# Patient Record
Sex: Female | Born: 1995 | Race: Black or African American | Hispanic: No | Marital: Single | Smoking: Current every day smoker
Health system: Southern US, Community
[De-identification: ages and names within clinical notes are randomized; demographics above are authoritative.]

## PROBLEM LIST (undated history)

## (undated) ENCOUNTER — Ambulatory Visit: Disposition: A | Discharge status: Home / Self Care | Payer: Medicaid Other

## (undated) DIAGNOSIS — A6000 Herpesviral infection of urogenital system, unspecified: Secondary | ICD-10-CM

## (undated) DIAGNOSIS — R569 Unspecified convulsions: Secondary | ICD-10-CM

## (undated) DIAGNOSIS — F329 Major depressive disorder, single episode, unspecified: Secondary | ICD-10-CM

## (undated) DIAGNOSIS — G919 Hydrocephalus, unspecified: Secondary | ICD-10-CM

## (undated) DIAGNOSIS — F319 Bipolar disorder, unspecified: Secondary | ICD-10-CM

## (undated) DIAGNOSIS — F32A Depression, unspecified: Secondary | ICD-10-CM

## (undated) DIAGNOSIS — D72829 Elevated white blood cell count, unspecified: Secondary | ICD-10-CM

## (undated) HISTORY — PX: TONSILLECTOMY: SUR1361

---

## 2013-11-18 ENCOUNTER — Encounter (HOSPITAL_COMMUNITY): Payer: Self-pay | Admitting: Emergency Medicine

## 2013-11-18 ENCOUNTER — Emergency Department (HOSPITAL_COMMUNITY)
Admission: EM | Admit: 2013-11-18 | Discharge: 2013-11-18 | Disposition: A | Discharge status: Home / Self Care | Payer: Medicaid Other | Attending: Emergency Medicine | Admitting: Emergency Medicine

## 2013-11-18 DIAGNOSIS — G919 Hydrocephalus, unspecified: Secondary | ICD-10-CM

## 2013-11-18 DIAGNOSIS — F17200 Nicotine dependence, unspecified, uncomplicated: Secondary | ICD-10-CM | POA: Insufficient documentation

## 2013-11-18 DIAGNOSIS — G911 Obstructive hydrocephalus: Secondary | ICD-10-CM | POA: Diagnosis not present

## 2013-11-18 DIAGNOSIS — Z79899 Other long term (current) drug therapy: Secondary | ICD-10-CM | POA: Diagnosis not present

## 2013-11-18 DIAGNOSIS — R519 Headache, unspecified: Secondary | ICD-10-CM

## 2013-11-18 DIAGNOSIS — R51 Headache: Secondary | ICD-10-CM | POA: Insufficient documentation

## 2013-11-18 DIAGNOSIS — G40909 Epilepsy, unspecified, not intractable, without status epilepticus: Secondary | ICD-10-CM | POA: Insufficient documentation

## 2013-11-18 HISTORY — DX: Unspecified convulsions: R56.9

## 2013-11-18 MED ORDER — LEVETIRACETAM 500 MG PO TABS: 500.0000 mg | ORAL_TABLET | Freq: Two times a day (BID) | ORAL | Status: DC

## 2013-11-18 MED ORDER — LEVETIRACETAM 500 MG PO TABS
500.0000 mg | ORAL_TABLET | Freq: Once | ORAL | Status: AC
  Administered 2013-11-18: 500 mg via ORAL
  Filled 2013-11-18: qty 1
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
## 2013-11-18 NOTE — Discharge Instructions (Signed)
You will need to be evaluated by neurology, and establish care as soon as possible. Take Keppra as directed for prevention of seizures, but make the appointment with the neurologist to have this monitored closely. Use tylenol or motrin for pain related to your headaches. Return to the ER for any worsening or changing symptoms   General Headache Without Cause A general headache is pain or discomfort felt around the head or neck area. The cause may not be found.  HOME CARE   Keep all doctor visits.  Only take medicines as told by your doctor.  Lie down in a dark, quiet room when you have a headache.  Keep a journal to find out if certain things bring on headaches. For example, write down:  What you eat and drink.  How much sleep you get.  Any change to your diet or medicines.  Relax by getting a massage or doing other relaxing activities.  Put ice or heat packs on the head and neck area as told by your doctor.  Lessen stress.  Sit up straight. Do not tighten (tense) your muscles.  Quit smoking if you smoke.  Lessen how much alcohol you drink.  Lessen how much caffeine you drink, or stop drinking caffeine.  Eat and sleep on a regular schedule.  Get 7 to 9 hours of sleep, or as told by your doctor.  Keep lights dim if bright lights bother you or make your headaches worse. GET HELP RIGHT AWAY IF:   Your headache becomes really bad.  You have a fever.  You have a stiff neck.  You have trouble seeing.  Your muscles are weak, or you lose muscle control.  You lose your balance or have trouble walking.  You feel like you will pass out (faint), or you pass out.  You have really bad symptoms that are different than your first symptoms.  You have problems with the medicines given to you by your doctor.  Your medicines do not work.  Your headache feels different than the other headaches.  You feel sick to your stomach (nauseous) or throw up (vomit). MAKE SURE YOU:     Understand these instructions.  Will watch your condition.  Will get help right away if you are not doing well or get worse. Document Released: 12/26/2007 Document Revised: 06/10/2011 Document Reviewed: 03/08/2011 [facility] Patient Information 2015 [place], [place]. This information is not intended to replace advice given to you by your health care provider. Make sure you discuss any questions you have with your health care provider.  Seizure, Adult A seizure is abnormal electrical activity in the brain. Seizures usually last from 30 seconds to 2 minutes. There are various types of seizures. Before a seizure, you may have a warning sensation (aura) that a seizure is about to occur. An aura may include the following symptoms:   Fear or anxiety.  Nausea.  Feeling like the room is spinning (vertigo).  Vision changes, such as seeing flashing lights or spots. Common symptoms during a seizure include:  A change in attention or behavior (altered mental status).  Convulsions with rhythmic jerking movements.  Drooling.  Rapid eye movements.  Grunting.  Loss of bladder and bowel control.  Bitter taste in the mouth.  Tongue biting. After a seizure, you may feel confused and sleepy. You may also have an injury resulting from convulsions during the seizure. HOME CARE INSTRUCTIONS   If you are given medicines, take them exactly as prescribed by your health care provider.  Keep all follow-up appointments as directed by your health care provider.  Do not swim or drive or engage in risky activity during which a seizure could cause further injury to you or others until your health care provider says it is OK.  Get adequate rest.  Teach friends and family what to do if you have a seizure. They should:  Lay you on the ground to prevent a fall.  Put a cushion under your head.  Loosen any tight clothing around your neck.  Turn you on your side. If vomiting occurs, this helps keep  your airway clear.  Stay with you until you recover.  Know whether or not you need emergency care. SEEK IMMEDIATE MEDICAL CARE IF:  The seizure lasts longer than 5 minutes.  The seizure is severe or you do not wake up immediately after the seizure.  You have an altered mental status after the seizure.  You are having more frequent or worsening seizures. Someone should drive you to the emergency department or call local emergency services (911 in [place] MAKE SURE YOU:  Understand these instructions.  Will watch your condition.  Will get help right away if you are not doing well or get worse. Document Released: 03/15/2000 Document Revised: 01/06/2013 Document Reviewed: 10/28/2012 [facility] Patient Information 2015 [place], [place]. This information is not intended to replace advice given to you by your health care provider. Make sure you discuss any questions you have with your health care provider.

## 2013-11-18 NOTE — ED Notes (Signed)
Pt presents with c/o headache. Pt has a hx of seizures and was seen for them earlier this week. Pt reports that she was not able to take her seizure medication today, c/o headache at this time.

## 2013-11-22 NOTE — ED Provider Notes (Signed)
Medical screening examination/treatment/procedure(s) were performed by non-physician practitioner and as supervising physician I was immediately available for consultation/collaboration.   EKG Interpretation None       Savva Beamer T Eh Sauseda, MD 11/22/13 1607 

## 2014-11-08 ENCOUNTER — Other Ambulatory Visit (HOSPITAL_COMMUNITY)
Admission: RE | Admit: 2014-11-08 | Discharge: 2014-11-08 | Disposition: A | Discharge status: Home / Self Care | Payer: Medicaid Other | Source: Ambulatory Visit | Attending: Emergency Medicine | Admitting: Emergency Medicine

## 2014-11-08 ENCOUNTER — Emergency Department (INDEPENDENT_AMBULATORY_CARE_PROVIDER_SITE_OTHER)
Admission: EM | Admit: 2014-11-08 | Discharge: 2014-11-08 | Disposition: A | Discharge status: Home / Self Care | Payer: Medicaid Other | Source: Home / Self Care | Attending: Emergency Medicine | Admitting: Emergency Medicine

## 2014-11-08 ENCOUNTER — Encounter (HOSPITAL_COMMUNITY): Payer: Self-pay | Admitting: Emergency Medicine

## 2014-11-08 DIAGNOSIS — N760 Acute vaginitis: Secondary | ICD-10-CM | POA: Insufficient documentation

## 2014-11-08 DIAGNOSIS — Z113 Encounter for screening for infections with a predominantly sexual mode of transmission: Secondary | ICD-10-CM | POA: Insufficient documentation

## 2014-11-08 DIAGNOSIS — A499 Bacterial infection, unspecified: Secondary | ICD-10-CM | POA: Diagnosis not present

## 2014-11-08 DIAGNOSIS — B9689 Other specified bacterial agents as the cause of diseases classified elsewhere: Secondary | ICD-10-CM

## 2014-11-08 DIAGNOSIS — A6000 Herpesviral infection of urogenital system, unspecified: Secondary | ICD-10-CM | POA: Diagnosis not present

## 2014-11-08 LAB — POCT PREGNANCY, URINE: Preg Test, Ur: NEGATIVE

## 2014-11-08 MED ORDER — METRONIDAZOLE 500 MG PO TABS: 500.0000 mg | ORAL_TABLET | Freq: Two times a day (BID) | ORAL | Status: DC

## 2014-11-08 NOTE — Discharge Instructions (Signed)
Bacterial Vaginosis Bacterial vaginosis is a vaginal infection that occurs when the normal balance of bacteria in the vagina is disrupted. It results from an overgrowth of certain bacteria. This is the most common vaginal infection in women of childbearing age. Treatment is important to prevent complications, especially in pregnant women, as it can cause a premature delivery. CAUSES  Bacterial vaginosis is caused by an increase in harmful bacteria that are normally present in smaller amounts in the vagina. Several different kinds of bacteria can cause bacterial vaginosis. However, the reason that the condition develops is not fully understood. RISK FACTORS Certain activities or behaviors can put you at an increased risk of developing bacterial vaginosis, including:  Having a new sex partner or multiple sex partners.  Douching.  Using an intrauterine device (IUD) for contraception. Women do not get bacterial vaginosis from toilet seats, bedding, swimming pools, or contact with objects around them. SIGNS AND SYMPTOMS  Some women with bacterial vaginosis have no signs or symptoms. Common symptoms include:  Grey vaginal discharge.  A fishlike odor with discharge, especially after sexual intercourse.  Itching or burning of the vagina and vulva.  Burning or pain with urination. DIAGNOSIS  Your health care provider will take a medical history and examine the vagina for signs of bacterial vaginosis. A sample of vaginal fluid may be taken. Your health care provider will look at this sample under a microscope to check for bacteria and abnormal cells. A vaginal pH test may also be done.  TREATMENT  Bacterial vaginosis may be treated with antibiotic medicines. These may be given in the form of a pill or a vaginal cream. A second round of antibiotics may be prescribed if the condition comes back after treatment.  HOME CARE INSTRUCTIONS   Only take over-the-counter or prescription medicines as  directed by your health care provider.  If antibiotic medicine was prescribed, take it as directed. Make sure you finish it even if you start to feel better.  Do not have sex until treatment is completed.  Tell all sexual partners that you have a vaginal infection. They should see their health care provider and be treated if they have problems, such as a mild rash or itching.  Practice safe sex by using condoms and only having one sex partner. SEEK MEDICAL CARE IF:   Your symptoms are not improving after 3 days of treatment.  You have increased discharge or pain.  You have a fever. MAKE SURE YOU:   Understand these instructions.  Will watch your condition.  Will get help right away if you are not doing well or get worse. FOR MORE INFORMATION  Centers for Disease Control and Prevention, Division of STD Prevention: [employer]raiser[employer]raud.fi American Sexual Health Association (ASHA): www.ashastd.org  Document Released: 03/18/2005 Document Revised: 01/06/2013 Document Reviewed: 10/28/2012 [facility] Patient Information 2015 [place], [place]. This information is not intended to replace advice given to you by your health care provider. Make sure you discuss any questions you have with your health care provider.  Genital Herpes Genital herpes is a sexually transmitted disease. This means that it is a disease passed by having sex with an infected person. There is no cure for genital herpes. The time between attacks can be months to years. The virus may live in a person but produce no problems (symptoms). This infection can be passed to a baby as it travels down the birth canal (vagina). In a newborn, this can cause central nervous system damage, eye damage, or even death. The  virus that causes genital herpes is usually HSV-2 virus. The virus that causes oral herpes is usually HSV-1. The diagnosis (learning what is wrong) is made through culture results. SYMPTOMS  Usually symptoms of pain and itching begin  a few days to a week after contact. It first appears as small blisters that progress to small painful ulcers which then scab over and heal after several days. It affects the outer genitalia, birth canal, cervix, penis, anal area, buttocks, and thighs. HOME CARE INSTRUCTIONS   Keep ulcerated areas dry and clean.  Take medications as directed. Antiviral medications can speed up healing. They will not prevent recurrences or cure this infection. These medications can also be taken for suppression if there are frequent recurrences.  While the infection is active, it is contagious. Avoid all sexual contact during active infections.  Condoms may help prevent spread of the herpes virus.  Practice safe sex.  Wash your hands thoroughly after touching the genital area.  Avoid touching your eyes after touching your genital area.  Inform your caregiver if you have had genital herpes and become pregnant. It is your responsibility to insure a safe outcome for your baby in this pregnancy.  Only take over-the-counter or prescription medicines for pain, discomfort, or fever as directed by your caregiver. SEEK MEDICAL CARE IF:   You have a recurrence of this infection.  You do not respond to medications and are not improving.  You have new sources of pain or discharge which have changed from the original infection.  You have an oral temperature above 102 F (38.9 C).  You develop abdominal pain.  You develop eye pain or signs of eye infection. Document Released: 03/15/2000 Document Revised: 06/10/2011 Document Reviewed: 04/05/2009 [facility] Patient Information 2015 [place], [place]. This information is not intended to replace advice given to you by your health care provider. Make sure you discuss any questions you have with your health care provider.

## 2014-11-08 NOTE — ED Provider Notes (Addendum)
CSN: [id number]     Arrival date & time 11/08/14  1300 History   First MD Initiated Contact with Patient 11/08/14 1327     Chief Complaint  Patient presents with  . Rash  . Vaginal Discharge   (Consider location/radiation/quality/duration/timing/severity/associated sxs/prior Treatment) HPI Comments: 19 year old female with a complaint of a rash to the right buttock adjacent to the anus with pain and recent occurrence of vesicles in an annular pattern. These are gradually fading away and there is only one very small lesion observed closely. Patient states they are improving and fading away. The second concern is that of a small amount of orange green vaginal discharge for 3-4 days. She is also complaining of a small amount of bleeding on a daily basis since she was placed on Implanon. She does not have a local PCP or GYN.   Past Medical History  Diagnosis Date  . Seizures    Past Surgical History  Procedure Laterality Date  . Tonsillectomy     No family history on file. History  Substance Use Topics  . Smoking status: Current Some Day Smoker  . Smokeless tobacco: Not on file  . Alcohol Use: No   OB History    No data available     Review of Systems  Constitutional: Negative.   HENT: Negative.   Respiratory: Negative.   Cardiovascular: Negative for chest pain.  Gastrointestinal: Negative.   Genitourinary: Positive for vaginal discharge, genital sores and menstrual problem. Negative for dysuria, urgency, frequency, flank pain, decreased urine volume, vaginal bleeding and pelvic pain.  Musculoskeletal: Negative.   Skin:       As per history of present illness  Neurological: Negative.   Psychiatric/Behavioral: Negative.     Allergies  Review of patient's allergies indicates no known allergies.  Home Medications   Prior to Admission medications   Medication Sig Start Date End Date Taking? Authorizing Provider  Etonogestrel (IMPLANON [place]) Inject 1 Units into the skin See  admin instructions. Every 3 years    Historical Provider, MD  levETIRAcetam (KEPPRA) 500 MG tablet Take 500 mg by mouth 2 (two) times daily.    Historical Provider, MD  levETIRAcetam (KEPPRA) 500 MG tablet Take 1 tablet (500 mg total) by mouth 2 (two) times daily. 11/18/13   Mercedes Camprubi-Soms, PA-C  metroNIDAZOLE (FLAGYL) 500 MG tablet Take 1 tablet (500 mg total) by mouth 2 (two) times daily. X 7 days 11/08/14   Hayden Rasmussen, NP   BP 121/82 mmHg  Pulse 81  Temp(Src) 99.6 F (37.6 C) (Oral)  Resp 18  SpO2 98% Physical Exam  Constitutional: She is oriented to person, place, and time. She appears well-developed and well-nourished. No distress.  Neck: Normal range of motion. Neck supple.  Cardiovascular: Normal rate.   Pulmonary/Chest: Effort normal. No respiratory distress.  Genitourinary:  Normal external female genitalia. Adjacent to the anus on the medial right buttock is a solitary and very faint red pinpoint lesion. It is difficult to visualize and palpate. (This was area and which she described a circle of papular vesicular painful lesions earlier this week.) There is a scant amount of malodorous vaginal discharge in the vaginal vault. Otherwise, there appears to be normal appearing clear mucoid physiologic fluid. The ectocervix is mildly erythematous. No lesions are visualized. Bimanual: No CMT or adnexal tenderness.  Musculoskeletal: She exhibits no edema.  Neurological: She is alert and oriented to person, place, and time. She exhibits normal muscle tone.  Skin: Skin is warm and dry.  Psychiatric: She has a normal mood and affect.  Nursing note and vitals reviewed.   ED Course  Procedures (including critical care time) Labs Review Labs Reviewed  POCT PREGNANCY, URINE   Results for orders placed or performed during the hospital encounter of 11/08/14  Pregnancy, urine POC  Result Value Ref Range   Preg Test, Ur NEGATIVE NEGATIVE   Results for orders placed or performed  during the hospital encounter of 11/08/14  Pregnancy, urine POC  Result Value Ref Range   Preg Test, Ur NEGATIVE NEGATIVE  Cervicovaginal ancillary only  Result Value Ref Range   Chlamydia **POSITIVE** (A)    Neisseria gonorrhea Negative   Cervicovaginal ancillary only  Result Value Ref Range   Wet Prep (BD Affirm) **POSITIVE for Gardnerella** (A)     Imaging Review No results found.   MDM   1. BV (bacterial vaginosis)   2. Vaginitis   3. Genital herpes    The area and which the patient describes having a rash has nearly completely resolved. Treat with Flagyl 500 mg twice a day. These to obtain PCP and GYN as soon as possible.    Hayden Rasmussen, NP 11/08/14 1418 Azithromycin 1 gm po per Rx, order to Michiel Cowboy, RN   Hayden Rasmussen, NP 11/10/14 2110

## 2014-11-08 NOTE — ED Notes (Signed)
Call back number verified.  

## 2014-11-08 NOTE — ED Notes (Signed)
C/o rash on perineal area onset 3-4 days Also reports a "greenish/orangy" vag d/c onset 3-4 days Denies fevers, chills Alert... No acute distress.

## 2014-11-09 LAB — CERVICOVAGINAL ANCILLARY ONLY
Chlamydia: POSITIVE — AB
Neisseria Gonorrhea: NEGATIVE

## 2014-11-10 LAB — CERVICOVAGINAL ANCILLARY ONLY: Wet Prep (BD Affirm): POSITIVE — AB

## 2014-11-10 NOTE — ED Notes (Signed)
Final report of STD testing shows positive chlamydia , negative GC. No treatment on day of visit for chlamydia Wet prep negative for trichomonas, yeast , positive for gardnerella treatment for BV adequate w metronidazole. Discussed findings w D Mabe, NP , who examined/treated patient. Authorized azythromycin 1 GM as a one time dose. Called number listed on record as home number. Female who answered phone stated no one by that name at that number. Letter sent to address provided at check in to [facility] to have her contact us for Rx. Form 2124 DHHS completed and faxed to [facility] for their records, and will call Rx in to pharmacy of choice when it is identified

## 2014-11-11 NOTE — ED Notes (Signed)
Called and discussed lab findings w Cheryln Manly, RN, STD nurse at [facility]. She will send a letter to residence to see if she can get a response.

## 2014-11-17 NOTE — ED Notes (Signed)
Call from patient, inquiring about her lab report. Gave a different contact number from one listed and verified at release. After verifying ID, discussed positive lab findings. Called Rx for 1 x dose of [facility]ithromycin to [facility] at [facility] at patient request. Spoke directly w Caryn Bee, pharmacist at [facility]. patient has been advised to avoid unprotected sex x 1 week to prevent re infection by her partner. She is to also inform her partner to have him treated as well, and she is to practice safer sex. Prentice Docker at [facility] advised of phone conversation w patient

## 2014-12-18 ENCOUNTER — Emergency Department (HOSPITAL_COMMUNITY)
Admission: EM | Admit: 2014-12-18 | Discharge: 2014-12-19 | Disposition: A | Discharge status: Home / Self Care | Payer: Medicaid Other | Attending: Emergency Medicine | Admitting: Emergency Medicine

## 2014-12-18 ENCOUNTER — Encounter (HOSPITAL_COMMUNITY): Payer: Self-pay | Admitting: Emergency Medicine

## 2014-12-18 DIAGNOSIS — R319 Hematuria, unspecified: Secondary | ICD-10-CM

## 2014-12-18 DIAGNOSIS — Z720 Tobacco use: Secondary | ICD-10-CM | POA: Diagnosis not present

## 2014-12-18 DIAGNOSIS — N390 Urinary tract infection, site not specified: Secondary | ICD-10-CM | POA: Insufficient documentation

## 2014-12-18 DIAGNOSIS — Z3202 Encounter for pregnancy test, result negative: Secondary | ICD-10-CM | POA: Insufficient documentation

## 2014-12-18 DIAGNOSIS — R109 Unspecified abdominal pain: Secondary | ICD-10-CM | POA: Diagnosis present

## 2014-12-18 LAB — COMPREHENSIVE METABOLIC PANEL
ALT: 18 U/L (ref 14–54)
AST: 20 U/L (ref 15–41)
Albumin: 4.6 g/dL (ref 3.5–5.0)
Alkaline Phosphatase: 53 U/L (ref 38–126)
Anion gap: 12 (ref 5–15)
BILIRUBIN TOTAL: 1.2 mg/dL (ref 0.3–1.2)
BUN: 14 mg/dL (ref 6–20)
CO2: 23 mmol/L (ref 22–32)
Calcium: 9.3 mg/dL (ref 8.9–10.3)
Chloride: 105 mmol/L (ref 101–111)
Creatinine, Ser: 0.78 mg/dL (ref 0.44–1.00)
GFR calc Af Amer: >60 mL/min (ref >60)
Glucose, Bld: 95 mg/dL (ref 65–99)
POTASSIUM: 3.5 mmol/L (ref 3.5–5.1)
Sodium: 140 mmol/L (ref 135–145)
TOTAL PROTEIN: 7.7 g/dL (ref 6.5–8.1)

## 2014-12-18 LAB — URINALYSIS, ROUTINE W REFLEX MICROSCOPIC
Glucose, UA: NEGATIVE mg/dL
Ketones, ur: >80 mg/dL — AB
Nitrite: POSITIVE — AB
Protein, ur: >300 mg/dL — AB
Specific Gravity, Urine: 1.028 (ref 1.005–1.030)
Urobilinogen, UA: 1 mg/dL (ref 0.0–1.0)
pH: 6.5 (ref 5.0–8.0)

## 2014-12-18 LAB — CBC
HCT: 44.9 % (ref 36.0–46.0)
Hemoglobin: 15.4 g/dL — ABNORMAL HIGH (ref 12.0–15.0)
MCH: 31.1 pg (ref 26.0–34.0)
MCHC: 34.3 g/dL (ref 30.0–36.0)
MCV: 90.7 fL (ref 78.0–100.0)
Platelets: 264 10*3/uL (ref 150–400)
RBC: 4.95 MIL/uL (ref 3.87–5.11)
RDW: 12.4 % (ref 11.5–15.5)
WBC: 19.6 10*3/uL — ABNORMAL HIGH (ref 4.0–10.5)

## 2014-12-18 LAB — I-STAT BETA HCG BLOOD, ED (MC, WL, AP ONLY): I-stat hCG, quantitative: <5 m[IU]/mL (ref <5)

## 2014-12-18 LAB — GRAM STAIN: Special Requests: NORMAL

## 2014-12-18 LAB — WET PREP, GENITAL

## 2014-12-18 LAB — URINE MICROSCOPIC-ADD ON

## 2014-12-18 LAB — LIPASE, BLOOD: Lipase: 28 U/L (ref 22–51)

## 2014-12-18 MED ORDER — HYDROMORPHONE HCL 1 MG/ML IJ SOLN
0.5000 mg | Freq: Once | INTRAMUSCULAR | Status: AC
  Administered 2014-12-18: 0.5 mg via INTRAVENOUS
  Filled 2014-12-18: qty 1

## 2014-12-18 MED ORDER — CEFTRIAXONE SODIUM 1 G IJ SOLR
1.0000 g | Freq: Once | INTRAMUSCULAR | Status: AC
  Administered 2014-12-18: 1 g via INTRAVENOUS
  Filled 2014-12-18: qty 10

## 2014-12-18 MED ORDER — SODIUM CHLORIDE 0.9 % IV BOLUS (SEPSIS)
1000.0000 mL | Freq: Once | INTRAVENOUS | Status: AC
  Administered 2014-12-18: 1000 mL via INTRAVENOUS

## 2014-12-18 MED ORDER — ONDANSETRON 4 MG PO TBDP: 4.0000 mg | ORAL_TABLET | Freq: Three times a day (TID) | ORAL | Status: DC | PRN

## 2014-12-18 MED ORDER — ONDANSETRON HCL 4 MG/2ML IJ SOLN
4.0000 mg | Freq: Once | INTRAMUSCULAR | Status: AC
  Administered 2014-12-18: 4 mg via INTRAVENOUS
  Filled 2014-12-18: qty 2

## 2014-12-18 MED ORDER — SULFAMETHOXAZOLE-TRIMETHOPRIM 800-160 MG PO TABS: 1.0000 | ORAL_TABLET | Freq: Two times a day (BID) | ORAL | Status: AC

## 2014-12-18 NOTE — ED Notes (Signed)
Reported urine gram stain results to Dr. Alycia Rossetti. MD acknowledges, no new orders.

## 2014-12-18 NOTE — ED Provider Notes (Signed)
CSN: [id number]     Arrival date & time 12/18/14  1913 History   First MD Initiated Contact with Patient 12/18/14 1936     Chief Complaint  Patient presents with  . Abdominal Pain   Patient is a 19 y.o. female presenting with general illness. The history is provided by the patient. No language interpreter was used.  Illness Location:  Abdomen Quality:  Pain Severity:  Moderate Onset quality:  Gradual Timing:  Constant Progression:  Worsening Chronicity:  New Context:  Abdominal pain. Onset during past week. Associated with nausea and nonbloody nonbilious emesis as well as dysuria and urinary frequency. Denies diarrhea or constipation. Denies fever, chills, SOB, CP, cough, vaginal bleeding, or vaginal discharge Associated symptoms: abdominal pain and vomiting   Associated symptoms: no congestion, no cough, no diarrhea, no fever, no headaches, no loss of consciousness, no rhinorrhea and no shortness of breath     Past Medical History  Diagnosis Date  . Seizures    Past Surgical History  Procedure Laterality Date  . Tonsillectomy     History reviewed. No pertinent family history. Social History  Substance Use Topics  . Smoking status: Current Some Day Smoker  . Smokeless tobacco: None  . Alcohol Use: No   OB History    No data available      Review of Systems  Constitutional: Negative for fever and chills.  HENT: Negative for congestion and rhinorrhea.   Respiratory: Negative for cough and shortness of breath.   Gastrointestinal: Positive for vomiting and abdominal pain. Negative for diarrhea and constipation.  Genitourinary: Positive for dysuria and frequency. Negative for vaginal bleeding and vaginal discharge.  Neurological: Negative for loss of consciousness and headaches.  All other systems reviewed and are negative.   Allergies  Review of patient's allergies indicates no known allergies.  Home Medications   Prior to Admission medications   Medication Sig  Start Date End Date Taking? Authorizing Provider  Etonogestrel (IMPLANON [place]) Inject 1 Units into the skin See admin instructions. Every 3 years   Yes Historical Provider, MD  ondansetron (ZOFRAN ODT) 4 MG disintegrating tablet Take 1 tablet (4 mg total) by mouth every 8 (eight) hours as needed for nausea or vomiting. 12/18/14   Angelina Ok, MD  sulfamethoxazole-trimethoprim (BACTRIM DS,SEPTRA DS) 800-160 MG per tablet Take 1 tablet by mouth 2 (two) times daily. 12/18/14 12/25/14  Angelina Ok, MD   BP 119/88 mmHg  Pulse 78  Temp(Src) 99 F (37.2 C) (Oral)  Resp 18  Ht  (1.6 m)  Wt 119 lb 6 oz (54.148 kg)  BMI 21.15 kg/m2  SpO2 100%  LMP 12/18/2014   Physical Exam  Constitutional: She is oriented to person, place, and time. She appears well-developed and well-nourished. She appears distressed.  HENT:  Head: Normocephalic and atraumatic.  Eyes: Conjunctivae are normal. Pupils are equal, round, and reactive to light.  Neck: Normal range of motion. Neck supple.  Cardiovascular: Normal rate, regular rhythm and intact distal pulses.   Pulmonary/Chest: Effort normal and breath sounds normal.  Abdominal: Soft. Bowel sounds are normal.  Genitourinary:  GU exam notable for small amount of blood at cervical os, no cervical friability, no CMT no adnexal tenderness  Musculoskeletal: Normal range of motion.  Neurological: She is alert and oriented to person, place, and time.  Skin: Skin is warm and dry.    ED Course  Procedures   Labs Review Labs Reviewed  WET PREP, GENITAL - Abnormal; Notable for the following:  Yeast Wet Prep HPF POC NONE (*)    Trich, Wet Prep NONE (*)    Clue Cells Wet Prep HPF POC NONE (*)    WBC, Wet Prep HPF POC FEW (*)    All other components within normal limits  CBC - Abnormal; Notable for the following:    WBC 19.6 (*)    Hemoglobin 15.4 (*)    All other components within normal limits  URINALYSIS, ROUTINE W REFLEX MICROSCOPIC (NOT AT [facility]) -  Abnormal; Notable for the following:    Color, Urine RED (*)    APPearance TURBID (*)    Hgb urine dipstick LARGE (*)    Bilirubin Urine LARGE (*)    Ketones, ur >80 (*)    Protein, ur >300 (*)    Nitrite POSITIVE (*)    Leukocytes, UA MODERATE (*)    All other components within normal limits  URINE MICROSCOPIC-ADD ON - Abnormal; Notable for the following:    Bacteria, UA MANY (*)    All other components within normal limits  GRAM STAIN  CULTURE, BLOOD (ROUTINE X 2)  CULTURE, BLOOD (ROUTINE X 2)  LIPASE, BLOOD  COMPREHENSIVE METABOLIC PANEL  I-STAT BETA HCG BLOOD, ED ([facility], [facility], [facility] ONLY)  GC/CHLAMYDIA PROBE AMP ([place]) NOT AT [facility]    Imaging Review No results found. I have personally reviewed and evaluated these images and lab results as part of my medical decision-making.   EKG Interpretation None      MDM  Ms. Googe is a 19 year old female presenting with abdominal pain. Onset during past week. Associated with nausea and nonbloody nonbilious emesis as well as dysuria and urinary frequency. Denies diarrhea or constipation. Denies fever, chills, SOB, CP, cough, vaginal bleeding, or vaginal discharge. Patient seen for same pain 4 days ago at which time she had a CT abdomen pelvis showing no acute intra-abdominal normal pathology.  Exam above notable for young female lying in stretcher in mild to moderate distress secondary to pain. Heart rate 70s 80s. Normotensive. Breathing well on room air and maintaining saturations. Patient complaining of abdominal pain but when distracted exam is benign with soft abdomen and no tenderness to palpation. GU exam showing small amount of blood at cervical os, no cervical friability, no CMT no adnexal tenderness. CVA TTP  Urine pregnant negative. UA showing large amount of leukocytes, positive nitrates, and moderate dehydration. Wet prep negative for yeast, Trichomonas, clue cells. Gonorrhea and Chlamydia sent but patient does not require  him. Treatment based on GU exam. CMP unremarkable. WBC 19.6. Given recent normal CT scan do not feel patient needs additional imaging.  Presentation consistent with UTI and possible pyelonephritis. Some improvement in symptoms after IV fluids, IV antiemetics, and IV antibiotics. Will attempt outpatient treatment with by mouth antibiotics. Strict ED return pressures discussed. Patient and boyfriend both understand and agree with the plan of no further questions concerning this time.  Patient care discussed with and followed by my attending, Dr. Blane Ohara  Final diagnoses:  Urinary tract infection with hematuria, site unspecified    Angelina Ok, MD 12/19/14 4098  Blane Ohara, MD 12/22/14 2217

## 2014-12-18 NOTE — ED Notes (Signed)
Per EMS, the patient was calling out for abdominal pain from home. En route was having pseudo seizures. Patient was sitting on the kitchen floor, shaking and then start crying. Occasionally stopped asking questions. Poor historians from people at home with her. Vomiting at some point as evidenced by vomit in trash can. 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 cbg 103, 134/98, p 100, rr 20, 97% on room air. IV present in right wrist. Picked up from [address]

## 2014-12-18 NOTE — ED Notes (Signed)
Called main lab, spoke to [place], in regards to adding on gram stain to urine sample

## 2014-12-19 LAB — GC/CHLAMYDIA PROBE AMP (~~LOC~~) NOT AT ARMC
Chlamydia: NEGATIVE
Neisseria Gonorrhea: NEGATIVE

## 2014-12-19 MED ORDER — ONDANSETRON 4 MG PO TBDP
4.0000 mg | ORAL_TABLET | Freq: Once | ORAL | Status: AC
  Administered 2014-12-19: 4 mg via ORAL
  Filled 2014-12-19: qty 1

## 2014-12-19 NOTE — ED Notes (Signed)
Dr. Alycia Rossetti at the bedside to update patient and family.

## 2014-12-19 NOTE — ED Notes (Signed)
Dr. Alycia Rossetti called as patient is still vomiting, and reporting pain medication did not help.

## 2014-12-23 LAB — CULTURE, BLOOD (ROUTINE X 2)
CULTURE: NO GROWTH
Culture: NO GROWTH

## 2016-07-14 ENCOUNTER — Encounter (HOSPITAL_COMMUNITY): Payer: Self-pay | Admitting: Emergency Medicine

## 2016-07-14 ENCOUNTER — Emergency Department (HOSPITAL_COMMUNITY)
Admission: EM | Admit: 2016-07-14 | Discharge: 2016-07-14 | Disposition: A | Discharge status: Home / Self Care | Payer: Medicaid Other | Attending: Emergency Medicine | Admitting: Emergency Medicine

## 2016-07-14 DIAGNOSIS — K0889 Other specified disorders of teeth and supporting structures: Secondary | ICD-10-CM | POA: Insufficient documentation

## 2016-07-14 DIAGNOSIS — Z79899 Other long term (current) drug therapy: Secondary | ICD-10-CM | POA: Insufficient documentation

## 2016-07-14 DIAGNOSIS — F17210 Nicotine dependence, cigarettes, uncomplicated: Secondary | ICD-10-CM | POA: Insufficient documentation

## 2016-07-14 MED ORDER — PENICILLIN V POTASSIUM 500 MG PO TABS: 500.0000 mg | ORAL_TABLET | Freq: Three times a day (TID) | ORAL | 0 refills | Status: DC

## 2016-07-14 MED ORDER — IBUPROFEN 800 MG PO TABS: 800.0000 mg | ORAL_TABLET | Freq: Three times a day (TID) | ORAL | 0 refills | Status: DC

## 2016-07-14 NOTE — ED Triage Notes (Signed)
Pt. Stated, I think My wisdom teeth ate coming in for the last 3 days and I need something more than Ibuprofen.

## 2016-07-14 NOTE — Discharge Instructions (Signed)
Please call and follow up with a dentist tomorrow for further management of your dental pain.

## 2016-07-14 NOTE — ED Provider Notes (Signed)
[facility]-EMERGENCY DEPT Provider Note   CSN: [id number] Arrival date & time: 07/14/16  4098     History   Chief Complaint Chief Complaint  Patient presents with  . Dental Pain    HPI Yvonne Waller is a 21 y.o. female.  HPI   21 year old female with history of seizure presenting complaining of dental pain. Patient report for the past 1 week she has had persistent throbbing sharp pain to her right lower molar not adequately improved despite taking Advil, ibuprofen, Orajel, and using heat pack at home. Pain is moderate in intensity, worse with chewing. Pain now radiates towards her jaw. She missed 2-3 days of work due to Pain. She denies having fever, chills, hearing changes, ear pain, sore throat, neck pain, or rash. Denies any injury. Last measure. Was 07/01/16. She does not have a Education officer, community.    Past Medical History:  Diagnosis Date  . Seizures (HCC)     There are no active problems to display for this patient.   Past Surgical History:  Procedure Laterality Date  . TONSILLECTOMY      OB History    No data available       Home Medications    Prior to Admission medications   Medication Sig Start Date End Date Taking? Authorizing Provider  Etonogestrel (IMPLANON [place]) Inject 1 Units into the skin See admin instructions. Every 3 years    Historical Provider, MD  ondansetron (ZOFRAN ODT) 4 MG disintegrating tablet Take 1 tablet (4 mg total) by mouth every 8 (eight) hours as needed for nausea or vomiting. 12/18/14   Angelina Ok, MD    Family History No family history on file.  Social History Social History  Substance Use Topics  . Smoking status: Current Some Day Smoker  . Smokeless tobacco: Current User  . Alcohol use No     Allergies   Patient has no known allergies.   Review of Systems Review of Systems  Constitutional: Negative for fever.  HENT: Positive for dental problem.   Skin: Negative for wound.  Neurological: Negative for numbness.      Physical Exam Updated Vital Signs BP 95/64 (BP Location: Left Arm)   Pulse 96   Temp 98.9 F (37.2 C) (Oral)   Resp 17   Ht  (1.626 m)   Wt 52.2 kg   LMP 07/01/2016   SpO2 97%   BMI 19.74 kg/m   Physical Exam  Constitutional: She appears well-developed and well-nourished. No distress.  HENT:  Head: Atraumatic.  Right Ear: External ear normal.  Left Ear: External ear normal.  Mouth: Tenderness noted to tooth #32 on palpation. No significant dental decay noted. No gingival erythema or abscess. No trismus.  Eyes: Conjunctivae are normal.  Neck: Neck supple.  Neurological: She is alert.  Skin: No rash noted.  Psychiatric: She has a normal mood and affect.  Nursing note and vitals reviewed.    ED Treatments / Results  Labs (all labs ordered are listed, but only abnormal results are displayed) Labs Reviewed - No data to display  EKG  EKG Interpretation None       Radiology No results found.  Procedures Procedures (including critical care time)  Medications Ordered in ED Medications - No data to display   Initial Impression / Assessment and Plan / ED Course  I have reviewed the triage vital signs and the nursing notes.  Pertinent labs & imaging results that were available during my care of the patient were reviewed by  me and considered in my medical decision making (see chart for details).     BP 95/64 (BP Location: Left Arm)   Pulse 96   Temp 98.9 F (37.2 C) (Oral)   Resp 17   Ht $RemoveBefo m)   Wt 52.2 kg   LMP 07/01/2016   SpO2 97%   BMI 19.74 kg/m    Final Clinical Impressions(s) / ED Diagnoses   Final diagnoses:  Pain, dental    New Prescriptions New Prescriptions   IBUPROFEN (ADVIL,MOTRIN) 800 MG TABLET    Take 1 tablet (800 mg total) by mouth 3 (three) times daily.   PENICILLIN V POTASSIUM (VEETID) 500 MG TABLET    Take 1 tablet (500 mg total) by mouth 3 (three) times daily.   Patient with dentalgia.  No abscess  requiring immediate incision and drainage.  Exam not concerning for Ludwig's angina or pharyngeal abscess.  Will treat with PCN, NSAIDs. Pt instructed to follow-up with dentist.  Discussed return precautions. Pt safe for discharge.    Fayrene Helper, PA-C 07/14/16 1610    Rolan Bucco, MD 07/14/16 [phone]

## 2016-09-15 ENCOUNTER — Encounter (HOSPITAL_COMMUNITY): Payer: Self-pay | Admitting: Oncology

## 2016-09-15 ENCOUNTER — Emergency Department (HOSPITAL_COMMUNITY): Payer: Medicaid Other

## 2016-09-15 ENCOUNTER — Emergency Department (HOSPITAL_COMMUNITY)
Admission: EM | Admit: 2016-09-15 | Discharge: 2016-09-15 | Disposition: A | Discharge status: Home / Self Care | Payer: Medicaid Other | Attending: Emergency Medicine | Admitting: Emergency Medicine

## 2016-09-15 DIAGNOSIS — F17200 Nicotine dependence, unspecified, uncomplicated: Secondary | ICD-10-CM | POA: Insufficient documentation

## 2016-09-15 DIAGNOSIS — R0789 Other chest pain: Secondary | ICD-10-CM | POA: Insufficient documentation

## 2016-09-15 DIAGNOSIS — Z79899 Other long term (current) drug therapy: Secondary | ICD-10-CM | POA: Insufficient documentation

## 2016-09-15 NOTE — ED Triage Notes (Signed)
Pt bib [facility] from home d/t a panic attack.  Per EMS pt reported waking up feeling as if she couldn't breathe.  Per EMS pt slept in the ambulance en route.  Pt ambulatory in triage.

## 2016-09-15 NOTE — ED Provider Notes (Signed)
[facility]-EMERGENCY DEPT Provider Note   CSN: [id number] Arrival date & time: 09/15/16  0342     History   Chief Complaint Chief Complaint  Patient presents with  . Panic Attack    HPI Yvonne Waller is a 21 y.o. female.  21 yo F with a cc of chest pain. This been going on for the past couple weeks. Worse with movement palpation. Last for a few minutes after onset. Having some mild shortness of breath with it. Denies exertional symptoms. Denies lower extremity edema denies estrogen use denies recent travel or recent surgery. No family history of MI.   The history is provided by the patient.  Chest Pain   This is a new problem. The current episode started more than 1 week ago. The problem occurs constantly. The problem has not changed since onset.The pain is present in the lateral region. The pain is at a severity of 7/10. The pain is moderate. The quality of the pain is described as brief and sharp. The pain does not radiate. Duration of episode(s) is 2 weeks. Associated symptoms include shortness of breath. Pertinent negatives include no dizziness, no fever, no headaches, no nausea, no palpitations and no vomiting. She has tried nothing for the symptoms. The treatment provided no relief. There are no known risk factors.  Pertinent negatives for past medical history include no DVT, no hyperlipidemia, no hypertension, no MI and no PE.  Pertinent negatives for family medical history include: no CAD.    Past Medical History:  Diagnosis Date  . Seizures (HCC)     There are no active problems to display for this patient.   Past Surgical History:  Procedure Laterality Date  . TONSILLECTOMY      OB History    No data available       Home Medications    Prior to Admission medications   Medication Sig Start Date End Date Taking? Authorizing Provider  Etonogestrel (IMPLANON [place]) Inject 1 Units into the skin See admin instructions. Every 3 years    [provider]    ibuprofen (ADVIL,MOTRIN) 800 MG tablet Take 1 tablet (800 mg total) by mouth 3 (three) times daily. 07/14/16   Fayrene Helper, PA-C  ondansetron (ZOFRAN ODT) 4 MG disintegrating tablet Take 1 tablet (4 mg total) by mouth every 8 (eight) hours as needed for nausea or vomiting. 12/18/14   Angelina Ok, MD  penicillin v potassium (VEETID) 500 MG tablet Take 1 tablet (500 mg total) by mouth 3 (three) times daily. 07/14/16   Fayrene Helper, PA-C    Family History No family history on file.  Social History Social History  Substance Use Topics  . Smoking status: Current Some Day Smoker  . Smokeless tobacco: Current User  . Alcohol use No     Allergies   Patient has no known allergies.   Review of Systems Review of Systems  Constitutional: Negative for chills and fever.  HENT: Negative for congestion and rhinorrhea.   Eyes: Negative for redness and visual disturbance.  Respiratory: Positive for shortness of breath. Negative for wheezing.   Cardiovascular: Positive for chest pain. Negative for palpitations.  Gastrointestinal: Negative for nausea and vomiting.  Genitourinary: Negative for dysuria and urgency.  Musculoskeletal: Negative for arthralgias and myalgias.  Skin: Negative for pallor and wound.  Neurological: Negative for dizziness and headaches.     Physical Exam Updated Vital Signs BP 99/64   Pulse 76   Temp 98.9 F (37.2 C) (Oral)   Resp 18  LMP 09/04/2016 (Approximate)   SpO2 96%   Physical Exam  Constitutional: She is oriented to person, place, and time. She appears well-developed and well-nourished. No distress.  HENT:  Head: Normocephalic and atraumatic.  Eyes: EOM are normal. Pupils are equal, round, and reactive to light.  Neck: Normal range of motion. Neck supple.  Cardiovascular: Normal rate and regular rhythm.  Exam reveals no gallop and no friction rub.   No murmur heard. Pulmonary/Chest: Effort normal. She has no wheezes. She has no rales.  Abdominal:  Soft. She exhibits no distension and no mass. There is no tenderness. There is no guarding.  Musculoskeletal: She exhibits no edema or tenderness.  Neurological: She is alert and oriented to person, place, and time.  Skin: Skin is warm and dry. She is not diaphoretic.  Psychiatric: She has a normal mood and affect. Her behavior is normal.  Nursing note and vitals reviewed.    ED Treatments / Results  Labs (all labs ordered are listed, but only abnormal results are displayed) Labs Reviewed - No data to display  EKG  EKG Interpretation  Date/Time:  "Sunday September 15 2016 04:20:01 EDT Ventricular Rate:  96 PR Interval:    QRS Duration: 82 QT Interval:  342 QTC Calculation: 433 R Axis:   79 Text Interpretation:  Sinus rhythm RSR' in V1 or V2, right VCD or RVH No old tracing to compare Confirmed by ,  ([id number]) on 09/15/2016 6:18:02 AM       Radiology Dg Chest 2 View  Result Date: 09/15/2016 CLINICAL DATA:  Acute onset of mid chest pressure. Initial encounter. EXAM: CHEST  2 VIEW COMPARISON:  None. FINDINGS: The lungs are well-aerated and clear. There is no evidence of focal opacification, pleural effusion or pneumothorax. The heart is normal in size; the mediastinal contour is within normal limits. No acute osseous abnormalities are seen. IMPRESSION: No acute cardiopulmonary process seen. Electronically Signed   By: Jeffery  Chang M.D.   On: 09/15/2016 05:39    Procedures Procedures (including critical care time)  Medications Ordered in ED Medications - No data to display   Initial Impression / Assessment and Plan / ED Course  I have reviewed the triage vital signs and the nursing notes.  Pertinent labs & imaging results that were available during my care of the patient were reviewed by me and considered in my medical decision making (see chart for details).     20"  yo F With a chief complaints of left-sided chest wall pain reproduced on palpation. Most likely  musculoskeletal. EKG and chest x-ray are unremarkable.  6:32 AM:  I have discussed the diagnosis/risks/treatment options with the patient and family and believe the pt to be eligible for discharge home to follow-up with PCP. We also discussed returning to the ED immediately if new or worsening sx occur. We discussed the sx which are most concerning (e.g., sudden worsening pain, fever, inability to tolerate by mouth) that necessitate immediate return. Medications administered to the patient during their visit and any new prescriptions provided to the patient are listed below.  Medications given during this visit Medications - No data to display   The patient appears reasonably screen and/or stabilized for discharge and I doubt any other medical condition or other [facility] requiring further screening, evaluation, or treatment in the ED at this time prior to discharge.    Final Clinical Impressions(s) / ED Diagnoses   Final diagnoses:  Chest wall pain    New Prescriptions New Prescriptions  No medications on file     Melene Plan, [place] 09/15/16 1610

## 2016-09-15 NOTE — Discharge Instructions (Signed)
Take 4 over the counter ibuprofen tablets 3 times a day or 2 over-the-counter naproxen tablets twice a day for pain. Also take tylenol 1000mg(2 extra strength) four times a day.    

## 2016-10-10 ENCOUNTER — Emergency Department (HOSPITAL_COMMUNITY)
Admission: EM | Admit: 2016-10-10 | Discharge: 2016-10-10 | Disposition: A | Discharge status: Home / Self Care | Payer: Medicaid Other | Attending: Emergency Medicine | Admitting: Emergency Medicine

## 2016-10-10 ENCOUNTER — Encounter (HOSPITAL_COMMUNITY): Payer: Self-pay

## 2016-10-10 DIAGNOSIS — L0291 Cutaneous abscess, unspecified: Secondary | ICD-10-CM | POA: Insufficient documentation

## 2016-10-10 DIAGNOSIS — Z720 Tobacco use: Secondary | ICD-10-CM | POA: Insufficient documentation

## 2016-10-10 HISTORY — DX: Depression, unspecified: F32.A

## 2016-10-10 HISTORY — DX: Hydrocephalus, unspecified: G91.9

## 2016-10-10 HISTORY — DX: Major depressive disorder, single episode, unspecified: F32.9

## 2016-10-10 HISTORY — DX: Herpesviral infection of urogenital system, unspecified: A60.00

## 2016-10-10 HISTORY — DX: Bipolar disorder, unspecified: F31.9

## 2016-10-10 LAB — URINALYSIS, ROUTINE W REFLEX MICROSCOPIC
Bilirubin Urine: NEGATIVE
Glucose, UA: NEGATIVE mg/dL
KETONES UR: NEGATIVE mg/dL
Leukocytes, UA: NEGATIVE
NITRITE: NEGATIVE
PROTEIN: NEGATIVE mg/dL
Specific Gravity, Urine: 1.02 (ref 1.005–1.030)
pH: 7 (ref 5.0–8.0)

## 2016-10-10 MED ORDER — LIDOCAINE HCL 2 % IJ SOLN
20.0000 mL | Freq: Once | INTRAMUSCULAR | Status: DC
  Filled 2016-10-10: qty 20

## 2016-10-10 NOTE — ED Triage Notes (Addendum)
Pt states she shaved bikini area about 1 weeks ago and now has several large bumps between rectum and vaginal causing pain and itching

## 2016-10-10 NOTE — ED Notes (Addendum)
EDP in with pt lancing abscess.  Abscess has started draining on its own.  I&D not needed.

## 2016-10-10 NOTE — Discharge Instructions (Signed)
Please read attached information. If you experience any new or worsening signs or symptoms please return to the emergency room for evaluation. Please follow-up with your primary care provider or specialist as discussed. Please use medication prescribed only as directed and discontinue taking if you have any concerning signs or symptoms.   °

## 2016-10-10 NOTE — ED Provider Notes (Signed)
[facility]-EMERGENCY DEPT Provider Note   CSN: [id number] Arrival date & time: 10/10/16  1601  By signing my name below, I, Yvonne Waller, attest that this documentation has been prepared under the direction and in the presence of Newell RubbermaidJeffrey Maylin [place], PA-C.  Electronically Signed: Rosario AdieWilliam Andrew Waller, ED Scribe. 10/10/16. 9:08 PM.  History   Chief Complaint Chief Complaint  Patient presents with  . Vaginal Itching   The history is provided by the patient. No language interpreter was used.   HPI Comments: Yvonne Waller is a 21 y.o. female with a PMHx of genital herpes, who presents to the Emergency Department complaining of persistent groin/vaginal itching beginning approximately one week ago. Pt reports that she shaved her groin area one week ago with a single straight blade and since then she has had vaginal itching and irritation to the area. She reports that this discomfort is worse with sitting and ambulation. She also notes a moderate, worsening area of pain and swelling to the area which began following shaving as well. No noted treatments for her symptoms were tried prior to coming into the ED. She denies fever, chills, drainage from the area, dysuria, vaginal discharge, nausea, vomiting, or any other associated symptoms.   Past Medical History:  Diagnosis Date  . Bipolar 1 disorder (HCC)   . Depression   . Herpes genitalis   . Hydrocephalus   . Seizures (HCC)    There are no active problems to display for this patient.  Past Surgical History:  Procedure Laterality Date  . TONSILLECTOMY     OB History    No data available     Home Medications    Prior to Admission medications   Medication Sig Start Date End Date Taking? Authorizing Provider  Etonogestrel (IMPLANON [place]) Inject 1 Units into the skin See admin instructions. Every 3 years    [provider]  ibuprofen (ADVIL,MOTRIN) 800 MG tablet Take 1 tablet (800 mg total) by mouth 3 (three) times daily. 07/14/16    Fayrene Helperran, [place], PA-C  ondansetron (ZOFRAN ODT) 4 MG disintegrating tablet Take 1 tablet (4 mg total) by mouth every 8 (eight) hours as needed for nausea or vomiting. 12/18/14   Angelina OkFranasiak, Ryan, MD  penicillin v potassium (VEETID) 500 MG tablet Take 1 tablet (500 mg total) by mouth 3 (three) times daily. 07/14/16   Fayrene Helperran, [place], PA-C   Family History No family history on file.  Social History Social History  Substance Use Topics  . Smoking status: Current Some Day Smoker  . Smokeless tobacco: Current User  . Alcohol use No   Allergies   Patient has no known allergies.  Review of Systems Review of Systems  Constitutional: Negative for fever.  Gastrointestinal: Negative for nausea and vomiting.  Genitourinary: Negative for dysuria and vaginal discharge.       +vaginal itching  Skin: Positive for wound.  All other systems reviewed and are negative.  Physical Exam Updated Vital Signs BP 117/69 (BP Location: Right Arm)   Pulse 80   Temp 98.4 F (36.9 C) (Oral)   Resp 16   LMP 10/10/2016   SpO2 100%   Physical Exam  Constitutional: She appears well-developed and well-nourished. No distress.  HENT:  Head: Normocephalic and atraumatic.  Eyes: Conjunctivae are normal.  Neck: Normal range of motion.  Cardiovascular: Normal rate.   Pulmonary/Chest: Effort normal.  Abdominal: She exhibits no distension.  Genitourinary:  Genitourinary Comments: Chaperone present throughout entire exam. 1cm abscess to the right upper inner  thigh.  Musculoskeletal: Normal range of motion.  Neurological: She is alert.  Skin: No pallor.  Psychiatric: She has a normal mood and affect. Her behavior is normal.  Nursing note and vitals reviewed.  ED Treatments / Results  DIAGNOSTIC STUDIES: Oxygen Saturation is 100% on RA, normal by my interpretation.   COORDINATION OF CARE: 6:20 PM-Discussed next steps with pt. Pt verbalized understanding and is agreeable with the plan.   Labs (all labs ordered  are listed, but only abnormal results are displayed) Labs Reviewed  URINALYSIS, ROUTINE W REFLEX MICROSCOPIC - Abnormal; Notable for the following:       Result Value   APPearance HAZY (*)    Hgb urine dipstick SMALL (*)    Bacteria, UA RARE (*)    Squamous Epithelial / LPF 0-5 (*)    All other components within normal limits    EKG  EKG Interpretation None       Radiology No results found.  Procedures Procedures   Medications Ordered in ED Medications  lidocaine (XYLOCAINE) 2 % (with pres) injection 400 mg (400 mg Infiltration Not Given 10/10/16 1851)    Initial Impression / Assessment and Plan / ED Course  I have reviewed the triage vital signs and the nursing notes.  Pertinent labs & imaging results that were available during my care of the patient were reviewed by me and considered in my medical decision making (see chart for details).      Assessment/Plan: Patient with small abscess.  This abscess ruptured prior to I&D.  Warm compress, no need for antibiotics.  Return precautions given.  Patient verbalized understanding and agreement to today's plan.  Final Clinical Impressions(s) / ED Diagnoses   Final diagnoses:  Abscess   New Prescriptions Discharge Medication List as of 10/10/2016  7:50 PM     [address]     Yvonne Mechanic, PA-C 10/10/16 2108    Doug Sou, MD 10/10/16 2330

## 2016-11-12 ENCOUNTER — Encounter (HOSPITAL_COMMUNITY): Payer: Self-pay | Admitting: Emergency Medicine

## 2016-11-12 ENCOUNTER — Emergency Department (HOSPITAL_COMMUNITY): Payer: Self-pay

## 2016-11-12 ENCOUNTER — Emergency Department (HOSPITAL_COMMUNITY)
Admission: EM | Admit: 2016-11-12 | Discharge: 2016-11-12 | Disposition: A | Discharge status: Home / Self Care | Payer: Self-pay | Attending: Emergency Medicine | Admitting: Emergency Medicine

## 2016-11-12 DIAGNOSIS — A599 Trichomoniasis, unspecified: Secondary | ICD-10-CM | POA: Insufficient documentation

## 2016-11-12 DIAGNOSIS — F17210 Nicotine dependence, cigarettes, uncomplicated: Secondary | ICD-10-CM | POA: Insufficient documentation

## 2016-11-12 DIAGNOSIS — R112 Nausea with vomiting, unspecified: Secondary | ICD-10-CM | POA: Insufficient documentation

## 2016-11-12 DIAGNOSIS — R197 Diarrhea, unspecified: Secondary | ICD-10-CM | POA: Insufficient documentation

## 2016-11-12 DIAGNOSIS — R1033 Periumbilical pain: Secondary | ICD-10-CM | POA: Insufficient documentation

## 2016-11-12 LAB — CBC
HEMATOCRIT: 43.4 % (ref 36.0–46.0)
HEMOGLOBIN: 14.9 g/dL (ref 12.0–15.0)
MCH: 31.2 pg (ref 26.0–34.0)
MCHC: 34.3 g/dL (ref 30.0–36.0)
MCV: 90.8 fL (ref 78.0–100.0)
Platelets: 239 10*3/uL (ref 150–400)
RBC: 4.78 MIL/uL (ref 3.87–5.11)
RDW: 13.1 % (ref 11.5–15.5)
WBC: 20.1 10*3/uL — ABNORMAL HIGH (ref 4.0–10.5)

## 2016-11-12 LAB — URINALYSIS, ROUTINE W REFLEX MICROSCOPIC
BILIRUBIN URINE: NEGATIVE
GLUCOSE, UA: NEGATIVE mg/dL
KETONES UR: NEGATIVE mg/dL
NITRITE: NEGATIVE
Protein, ur: NEGATIVE mg/dL
Specific Gravity, Urine: 1.024 (ref 1.005–1.030)
pH: 5 (ref 5.0–8.0)

## 2016-11-12 LAB — COMPREHENSIVE METABOLIC PANEL
ALBUMIN: 4.9 g/dL (ref 3.5–5.0)
ALT: 11 U/L — ABNORMAL LOW (ref 14–54)
ANION GAP: 7 (ref 5–15)
AST: 17 U/L (ref 15–41)
Alkaline Phosphatase: 57 U/L (ref 38–126)
BUN: 12 mg/dL (ref 6–20)
CO2: 24 mmol/L (ref 22–32)
Calcium: 9.3 mg/dL (ref 8.9–10.3)
Chloride: 109 mmol/L (ref 101–111)
Creatinine, Ser: 0.62 mg/dL (ref 0.44–1.00)
GFR calc non Af Amer: >60 mL/min (ref >60)
GLUCOSE: 90 mg/dL (ref 65–99)
POTASSIUM: 4.1 mmol/L (ref 3.5–5.1)
SODIUM: 140 mmol/L (ref 135–145)
Total Bilirubin: 0.5 mg/dL (ref 0.3–1.2)
Total Protein: 7.8 g/dL (ref 6.5–8.1)

## 2016-11-12 LAB — WET PREP, GENITAL
Clue Cells Wet Prep HPF POC: NONE SEEN
SPERM: NONE SEEN
Yeast Wet Prep HPF POC: NONE SEEN

## 2016-11-12 LAB — LIPASE, BLOOD: Lipase: 21 U/L (ref 11–51)

## 2016-11-12 LAB — PREGNANCY, URINE: Preg Test, Ur: NEGATIVE

## 2016-11-12 MED ORDER — SODIUM CHLORIDE 0.9 % IV BOLUS (SEPSIS)
1000.0000 mL | Freq: Once | INTRAVENOUS | Status: AC
  Administered 2016-11-12: 1000 mL via INTRAVENOUS

## 2016-11-12 MED ORDER — METRONIDAZOLE 500 MG PO TABS
2000.0000 mg | ORAL_TABLET | Freq: Once | ORAL | Status: AC
  Administered 2016-11-12: 2000 mg via ORAL
  Filled 2016-11-12: qty 4

## 2016-11-12 MED ORDER — AZITHROMYCIN 250 MG PO TABS
1000.0000 mg | ORAL_TABLET | Freq: Once | ORAL | Status: AC
  Administered 2016-11-12: 1000 mg via ORAL
  Filled 2016-11-12: qty 4

## 2016-11-12 MED ORDER — IOPAMIDOL (ISOVUE-300) INJECTION 61%
INTRAVENOUS | Status: AC
  Filled 2016-11-12: qty 30

## 2016-11-12 MED ORDER — MORPHINE SULFATE (PF) 2 MG/ML IV SOLN
4.0000 mg | Freq: Once | INTRAVENOUS | Status: AC
  Administered 2016-11-12: 4 mg via INTRAVENOUS
  Filled 2016-11-12: qty 2

## 2016-11-12 MED ORDER — ONDANSETRON HCL 4 MG/2ML IJ SOLN
4.0000 mg | Freq: Once | INTRAMUSCULAR | Status: AC
  Administered 2016-11-12: 4 mg via INTRAVENOUS
  Filled 2016-11-12: qty 2

## 2016-11-12 MED ORDER — IOPAMIDOL (ISOVUE-300) INJECTION 61%
100.0000 mL | Freq: Once | INTRAVENOUS | Status: AC | PRN
  Administered 2016-11-12: 80 mL via INTRAVENOUS

## 2016-11-12 MED ORDER — CEFTRIAXONE SODIUM 250 MG IJ SOLR
250.0000 mg | Freq: Once | INTRAMUSCULAR | Status: AC
  Administered 2016-11-12: 250 mg via INTRAMUSCULAR
  Filled 2016-11-12: qty 250

## 2016-11-12 MED ORDER — LIDOCAINE HCL (PF) 1 % IJ SOLN
2.0000 mL | Freq: Once | INTRAMUSCULAR | Status: AC
  Administered 2016-11-12: 2 mL
  Filled 2016-11-12: qty 30

## 2016-11-12 MED ORDER — SODIUM CHLORIDE 0.9 % IJ SOLN
INTRAMUSCULAR | Status: AC
  Filled 2016-11-12: qty 50

## 2016-11-12 MED ORDER — IOPAMIDOL (ISOVUE-300) INJECTION 61%
INTRAVENOUS | Status: AC
  Filled 2016-11-12: qty 100

## 2016-11-12 NOTE — ED Triage Notes (Signed)
Pt arrived via EMS c/o generalized abd pain that started this morning with vomiting. Pt reports decreased appetite x 1 week. Denies urinary symptoms.

## 2016-11-12 NOTE — ED Notes (Signed)
Patient transported to CT 

## 2016-11-12 NOTE — ED Notes (Signed)
Patient requesting additional pain medication. PA made aware. 

## 2016-11-12 NOTE — ED Notes (Signed)
Bed: [id number] Expected date:  Expected time:  Means of arrival:  Comments: 21 y/o F abd pain NVD

## 2016-11-12 NOTE — Discharge Instructions (Signed)
°  Please inform all sexual partners about diagnosis as they will need to be tested as well.  Use a condom with every sexual encounter Follow up with your OBGYN or the healthy department in regards to today's visit.   Please return to the ER for new or worsening symptoms, high fevers or persistent vomiting.  You have been tested for chlamydia and gonorrhea. These results will be available in approximately 3 days. You will be notified if they are positive.

## 2016-11-12 NOTE — ED Notes (Signed)
IV attempted x 2 by another Rn without success.

## 2016-11-12 NOTE — ED Notes (Signed)
IV attempted x2 without success.

## 2016-11-12 NOTE — ED Notes (Signed)
US at bedside

## 2016-11-12 NOTE — ED Provider Notes (Signed)
[facility]-EMERGENCY DEPT Provider Note   CSN: [id number] Arrival date & time: 11/12/16  1255     History   Chief Complaint Chief Complaint  Patient presents with  . Abdominal Pain    HPI Yvonne Waller is a 21 y.o. female.  The history is provided by the patient and medical records. No language interpreter was used.   Yvonne Waller is a 21 y.o. female  with a PMH of bipolar disorder, depression, seizures who presents to the Emergency Department complaining of acute onset of central and RLQ abdominal pain which began this morning. Patient states that she felt "quesy" at the end of her shift last night. She didn't eat anything for dinner, but instead, just went to bed. This morning, again felt nauseous, then sudden onset of pain began. She endorses 5-6 episodes of emesis and has not been able to keep any food/fluids down today. Also endorses 2 loose stools, unsure if bloody or not. Denies fevers/chills. No sick contacts. No alleviating/aggravating factors noted. No medications taken prior to arrival for symptoms. She is on her menstrual cycle which she started two days ago. No chest pain, shortness of breath, back pain, dysuria, urinary urgency/frequency, vaginal discharge.    Past Medical History:  Diagnosis Date  . Bipolar 1 disorder (HCC)   . Depression   . Herpes genitalis   . Hydrocephalus   . Seizures (HCC)     There are no active problems to display for this patient.   Past Surgical History:  Procedure Laterality Date  . TONSILLECTOMY      OB History    No data available       Home Medications    Prior to Admission medications   Medication Sig Start Date End Date Taking? Authorizing Provider  Etonogestrel (IMPLANON [place]) Inject 1 Units into the skin See admin instructions. Every 3 years   Yes [provider]  ibuprofen (ADVIL,MOTRIN) 800 MG tablet Take 1 tablet (800 mg total) by mouth 3 (three) times daily. Patient not taking: Reported on 11/12/2016  07/14/16   Fayrene Helperran, [place], PA-C  ondansetron (ZOFRAN ODT) 4 MG disintegrating tablet Take 1 tablet (4 mg total) by mouth every 8 (eight) hours as needed for nausea or vomiting. Patient not taking: Reported on 11/12/2016 12/18/14   Angelina OkFranasiak, Ryan, MD  penicillin v potassium (VEETID) 500 MG tablet Take 1 tablet (500 mg total) by mouth 3 (three) times daily. Patient not taking: Reported on 11/12/2016 07/14/16   Fayrene Helperran, [place], PA-C    Family History No family history on file.  Social History Social History  Substance Use Topics  . Smoking status: Current Every Day Smoker  . Smokeless tobacco: Current User  . Alcohol use No     Allergies   Patient has no known allergies.   Review of Systems Review of Systems  Gastrointestinal: Positive for abdominal pain, diarrhea, nausea and vomiting.  All other systems reviewed and are negative.    Physical Exam Updated Vital Signs BP 96/62 (BP Location: Right Arm)   Pulse 69   Temp 98.1 F (36.7 C) (Oral)   Resp 12   Ht 5\' 3"  (1.6 m)   Wt 53.1 kg (117 lb)   LMP 11/10/2016   SpO2 99%   BMI 20.73 kg/m   Physical Exam  Constitutional: She is oriented to person, place, and time. She appears well-developed and well-nourished. No distress.  HENT:  Head: Normocephalic and atraumatic.  Cardiovascular: Normal rate, regular rhythm and normal heart sounds.   No  murmur heard. Pulmonary/Chest: Effort normal and breath sounds normal. No respiratory distress.  Abdominal: Soft. She exhibits no distension.  Tenderness to palpation at McBurney's with guarding. No rebound tenderness. No CVA tenderness.  Musculoskeletal: Normal range of motion.  Neurological: She is alert and oriented to person, place, and time.  Skin: Skin is warm and dry.  Nursing note and vitals reviewed.    ED Treatments / Results  Labs (all labs ordered are listed, but only abnormal results are displayed) Labs Reviewed  WET PREP, GENITAL - Abnormal; Notable for the following:         Result Value   Trich, Wet Prep PRESENT (*)    WBC, Wet Prep HPF POC FEW (*)    All other components within normal limits  COMPREHENSIVE METABOLIC PANEL - Abnormal; Notable for the following:    ALT 11 (*)    All other components within normal limits  CBC - Abnormal; Notable for the following:    WBC 20.1 (*)    All other components within normal limits  URINALYSIS, ROUTINE W REFLEX MICROSCOPIC - Abnormal; Notable for the following:    Hgb urine dipstick MODERATE (*)    Leukocytes, UA TRACE (*)    Bacteria, UA MANY (*)    Squamous Epithelial / LPF 0-5 (*)    All other components within normal limits  LIPASE, BLOOD  PREGNANCY, URINE  GC/CHLAMYDIA PROBE AMP ([place]) NOT AT [facility]    EKG  EKG Interpretation None       Radiology US Transvaginal Non-ob  Result Date: 11/12/2016 CLINICAL DATA:  Lower abdominal/ pelvic pain EXAM: TRANSABDOMINAL AND TRANSVAGINAL ULTRASOUND OF PELVIS TECHNIQUE: Study was performed transabdominally to optimize pelvic field of view evaluation and transvaginally to optimize internal visceral architecture evaluation. COMPARISON:  CT abdomen and pelvis November 12, 2016 FINDINGS: Uterus Measurements: 8.5 x 4.3 x 5.2 cm. No fibroids or other mass visualized. Endometrium Thickness: 2 mm.  No focal abnormality visualized. Right ovary Measurements: 3.2 x 2.7 x 2.5 cm. There is a cystic area, likely dominant follicle, arising in the right ovary measuring 2.3 x 2.3 x 2.1 cm. No other right-sided pelvic mass. There is a small amount of adjacent free pelvic fluid. Left ovary Measurements: 2.7 x 1.6 x 2.0 cm. Normal appearance/no adnexal mass. Other findings Small amount of free pelvic fluid adjacent to right ovary. IMPRESSION: Small cystic area, likely dominant follicle, and right ovary with a small amount of adjacent fluid. Question recent ovarian cyst leakage. Study otherwise unremarkable. Electronically Signed   By: Bretta Bang III M.D.   On: 11/12/2016 19:52    US Pelvis Complete  Result Date: 11/12/2016 CLINICAL DATA:  Lower abdominal/ pelvic pain EXAM: TRANSABDOMINAL AND TRANSVAGINAL ULTRASOUND OF PELVIS TECHNIQUE: Study was performed transabdominally to optimize pelvic field of view evaluation and transvaginally to optimize internal visceral architecture evaluation. COMPARISON:  CT abdomen and pelvis November 12, 2016 FINDINGS: Uterus Measurements: 8.5 x 4.3 x 5.2 cm. No fibroids or other mass visualized. Endometrium Thickness: 2 mm.  No focal abnormality visualized. Right ovary Measurements: 3.2 x 2.7 x 2.5 cm. There is a cystic area, likely dominant follicle, arising in the right ovary measuring 2.3 x 2.3 x 2.1 cm. No other right-sided pelvic mass. There is a small amount of adjacent free pelvic fluid. Left ovary Measurements: 2.7 x 1.6 x 2.0 cm. Normal appearance/no adnexal mass. Other findings Small amount of free pelvic fluid adjacent to right ovary. IMPRESSION: Small cystic area, likely dominant follicle, and right  ovary with a small amount of adjacent fluid. Question recent ovarian cyst leakage. Study otherwise unremarkable. Electronically Signed   By: Bretta Bang III M.D.   On: 11/12/2016 19:52   Ct Abdomen Pelvis W Contrast  Result Date: 11/12/2016 CLINICAL DATA:  Abdominal pain and vomiting starting this morning. Reduced appetite. EXAM: CT ABDOMEN AND PELVIS WITH CONTRAST TECHNIQUE: Multidetector CT imaging of the abdomen and pelvis was performed using the standard protocol following bolus administration of intravenous contrast. CONTRAST:  80mL ISOVUE-300 IOPAMIDOL (ISOVUE-300) INJECTION 61% COMPARISON:  Chest radiograph from 09/15/2016 FINDINGS: Lower chest: Unremarkable Hepatobiliary: Unremarkable Pancreas: Unremarkable Spleen: Unremarkable Adrenals/Urinary Tract: Adrenal glands normal. 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 Stomach/Bowel: The appendix appears  normal particularly on the multiplanar imaging. Terminal ileum unremarkable. Borderline prominent caliber of the cecum but no cecal wall thickening. No dilated bowel. Several air-fluid levels are present in nondilated mid abdominal small bowel. Vascular/Lymphatic: Unremarkable Reproductive: 2.7 by 1.7 by 2.2 cm cystic lesion in the right ovary. Indistinctness of tissue planes especially along the ovaries and parametrial region, with a lesser degree of stranding extending in the right lower quadrant omentum. Uterine contour normal. No specific left ovarian abnormality. Other: As noted above there is stranding especially in the pelvis causing obscuration of margins of the uterus and adnexa. Is potentially a small amount of free pelvic fluid in the cul-de-sac. Musculoskeletal: Sacral Tarlov cause thinning of the sacrum. IMPRESSION: 1. Stranding in the pelvis potentially with a small amount of free pelvic fluid. There is some subtle stranding in the lower omentum, right greater the left. There is also a 2.7 cm right ovarian cystic lesion which could conceivably be a source for inflammation. I note that the patient's pregnancy test is negative today and accordingly ectopic pregnancy is not suspected. This could be a complex cyst. I am more skeptical of tubo-ovarian abscess given the relatively thin walls, but pelvic sonography may be warranted for further workup. 2. The appendix does not appear inflamed. 3. 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 4. 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 Electronically Signed   By: Gaylyn Rong M.D.   On: 11/12/2016 17:10    Procedures Procedures (including critical care time)  Medications Ordered in ED Medications  iopamidol (ISOVUE-300) 61 % injection (not administered)  iopamidol (ISOVUE-300) 61 % injection (not administered)  sodium chloride 0.9 % injection (not administered)  cefTRIAXone  (ROCEPHIN) injection 250 mg (not administered)  azithromycin (ZITHROMAX) tablet 1,000 mg (not administered)  metroNIDAZOLE (FLAGYL) tablet 2,000 mg (not administered)  lidocaine (PF) (XYLOCAINE) 1 % injection 2 mL (not administered)  sodium chloride 0.9 % bolus 1,000 mL (1,000 mLs Intravenous New Bag/Given 11/12/16 1503)  morphine 2 MG/ML injection 4 mg (4 mg Intravenous Given 11/12/16 1504)  ondansetron (ZOFRAN) injection 4 mg (4 mg Intravenous Given 11/12/16 1504)  iopamidol (ISOVUE-300) 61 % injection 100 mL (80 mLs Intravenous Contrast Given 11/12/16 1644)     Initial Impression / Assessment and Plan / ED Course  I have reviewed the triage vital signs and the nursing notes.  Pertinent labs & imaging results that were available during my care of the patient were reviewed by me and considered in my medical decision making (see chart for details).    Yvonne Waller is a 21 y.o. female who presents to ED for abdominal pain which  began this morning. Tenderness to RLQ on exam. Labs notable for leukocytosis of 20.1. UA with many bacteria and trich present. Patient not having any urinary symptoms. Wet prep with trich and few wbc's. CT obtained and reviewed:  IMPRESSION: 1. Stranding in the pelvis potentially with a small amount of free pelvic fluid. There is some subtle stranding in the lower omentum, right greater the left. There is also a 2.7 cm right ovarian cystic lesion which could conceivably be a source for inflammation. I note that the patient's pregnancy test is negative today and accordingly ectopic pregnancy is not suspected. This could be a complex cyst. I am more skeptical of tubo-ovarian abscess given the relatively thin walls, but pelvic sonography may be warranted for further workup. 2. The appendix does not appear inflamed.  Pelvic exam with no cervical motion or adnexal tenderness, however given elevated white count and bacteria in urine, will obtain pelvic ultrasound for  further characterization to r/o TOA.   Ultrasound reviewed showing a small cystic area consistent with a follicle otherwise unremarkable. Repeat abdominal exam with no peritoneal signs. Will have patient follow up with OB/GYN. Patient treated prophylactically for gonorrhea and chlamydia with azithromycin/Rocephin. 2 g Flagyl given for Trichomonas. Patient informed to tell all sexual partners without diagnosis. Results return to ER discussed and all questions answered.  Final Clinical Impressions(s) / ED Diagnoses   Final diagnoses:  Trichomonosis    New Prescriptions New Prescriptions   No medications on file     Ziyad Dyar, Chase Picket, Cordelia Poche 11/12/16 2038    Benjiman Core, MD 11/13/16 [phone]

## 2016-11-13 ENCOUNTER — Emergency Department (HOSPITAL_COMMUNITY)
Admission: EM | Admit: 2016-11-13 | Discharge: 2016-11-13 | Disposition: A | Discharge status: Home / Self Care | Payer: Self-pay | Attending: Physician Assistant | Admitting: Physician Assistant

## 2016-11-13 ENCOUNTER — Emergency Department (HOSPITAL_COMMUNITY): Payer: Self-pay

## 2016-11-13 ENCOUNTER — Encounter (HOSPITAL_COMMUNITY): Payer: Self-pay | Admitting: Emergency Medicine

## 2016-11-13 DIAGNOSIS — N83291 Other ovarian cyst, right side: Secondary | ICD-10-CM | POA: Insufficient documentation

## 2016-11-13 DIAGNOSIS — N83201 Unspecified ovarian cyst, right side: Secondary | ICD-10-CM

## 2016-11-13 DIAGNOSIS — F17200 Nicotine dependence, unspecified, uncomplicated: Secondary | ICD-10-CM | POA: Insufficient documentation

## 2016-11-13 LAB — COMPREHENSIVE METABOLIC PANEL
ALT: 12 U/L — ABNORMAL LOW (ref 14–54)
ANION GAP: 13 (ref 5–15)
AST: 26 U/L (ref 15–41)
Albumin: 4.5 g/dL (ref 3.5–5.0)
Alkaline Phosphatase: 49 U/L (ref 38–126)
BUN: 8 mg/dL (ref 6–20)
CHLORIDE: 105 mmol/L (ref 101–111)
CO2: 20 mmol/L — ABNORMAL LOW (ref 22–32)
Calcium: 9.5 mg/dL (ref 8.9–10.3)
Creatinine, Ser: 0.74 mg/dL (ref 0.44–1.00)
Glucose, Bld: 96 mg/dL (ref 65–99)
POTASSIUM: 3.9 mmol/L (ref 3.5–5.1)
Sodium: 138 mmol/L (ref 135–145)
TOTAL PROTEIN: 7.2 g/dL (ref 6.5–8.1)
Total Bilirubin: 0.9 mg/dL (ref 0.3–1.2)

## 2016-11-13 LAB — URINALYSIS, ROUTINE W REFLEX MICROSCOPIC
Bilirubin Urine: NEGATIVE
GLUCOSE, UA: NEGATIVE mg/dL
KETONES UR: 80 mg/dL — AB
NITRITE: NEGATIVE
PH: 5 (ref 5.0–8.0)
PROTEIN: NEGATIVE mg/dL
Specific Gravity, Urine: 1.028 (ref 1.005–1.030)

## 2016-11-13 LAB — CBC
HCT: 44 % (ref 36.0–46.0)
Hemoglobin: 15.2 g/dL — ABNORMAL HIGH (ref 12.0–15.0)
MCH: 31.7 pg (ref 26.0–34.0)
MCHC: 34.5 g/dL (ref 30.0–36.0)
MCV: 91.7 fL (ref 78.0–100.0)
PLATELETS: 219 10*3/uL (ref 150–400)
RBC: 4.8 MIL/uL (ref 3.87–5.11)
RDW: 13.1 % (ref 11.5–15.5)
WBC: 20.2 10*3/uL — AB (ref 4.0–10.5)

## 2016-11-13 LAB — POC URINE PREG, ED: Preg Test, Ur: NEGATIVE

## 2016-11-13 LAB — GC/CHLAMYDIA PROBE AMP (~~LOC~~) NOT AT ARMC
Chlamydia: POSITIVE — AB
NEISSERIA GONORRHEA: NEGATIVE

## 2016-11-13 MED ORDER — ONDANSETRON HCL 4 MG PO TABS: 4.0000 mg | ORAL_TABLET | Freq: Three times a day (TID) | ORAL | 0 refills | Status: AC | PRN

## 2016-11-13 MED ORDER — SODIUM CHLORIDE 0.9 % IV BOLUS (SEPSIS)
1000.0000 mL | Freq: Once | INTRAVENOUS | Status: AC
  Administered 2016-11-13: 1000 mL via INTRAVENOUS

## 2016-11-13 MED ORDER — DOXYCYCLINE HYCLATE 100 MG PO TABS
100.0000 mg | ORAL_TABLET | Freq: Once | ORAL | Status: AC
  Administered 2016-11-13: 100 mg via ORAL
  Filled 2016-11-13: qty 1

## 2016-11-13 MED ORDER — ONDANSETRON HCL 4 MG/2ML IJ SOLN
4.0000 mg | Freq: Once | INTRAMUSCULAR | Status: AC
  Administered 2016-11-13: 4 mg via INTRAVENOUS
  Filled 2016-11-13: qty 2

## 2016-11-13 MED ORDER — DOXYCYCLINE HYCLATE 50 MG PO CAPS: 100.0000 mg | ORAL_CAPSULE | Freq: Two times a day (BID) | ORAL | 0 refills | Status: DC

## 2016-11-13 NOTE — ED Triage Notes (Addendum)
Pt arrives via [facility] from home for c/o generalized abd pain and vomiting since yesterday, [facility] and diagnosed with std and ovarian cyst. Pt crying and shaking on bed upon arrival. EMS reports pt requested morphine, no meds given pta.

## 2016-11-13 NOTE — Discharge Instructions (Signed)
You were evaluated in the ED for abdominal pain. We rechecked imaging today which is still showing the ovarian cyst. We are going to treat you with antibiotic doxycycline twice a day for 14 days to be careful and prevent pelvic inflammatory disease. Please establish with a primary care doctor to follow up on this as outpatient, contact information is below.

## 2016-11-13 NOTE — ED Notes (Signed)
Patient transported to Ultrasound 

## 2016-11-13 NOTE — ED Notes (Signed)
ED Provider at bedside. 

## 2016-11-13 NOTE — ED Provider Notes (Signed)
[facility]-EMERGENCY DEPT Provider Note   CSN: [id number] Arrival date & time: 11/13/16  6962     History   Chief Complaint Chief Complaint  Patient presents with  . Abdominal Pain    HPI Yvonne Waller is a 21 y.o. female.  Patient is a 20yo with PMH of bipolar disorder, depression, seizures who presents to ED with worsening abdominal pain. Was seen yesterday for sudden onset RLQ pain and was treated with trichomoniasis and told had ovarian cyst. She returns today with worsening abdominal pain and nausea. States no new symptoms but still having sharp abdominal pain in RLQ and umbilical area, NBNB vomiting, no blood in stool, no urinary symptoms. Does endorse chills but denies fever.       Past Medical History:  Diagnosis Date  . Bipolar 1 disorder (HCC)   . Depression   . Herpes genitalis   . Hydrocephalus   . Seizures (HCC)     There are no active problems to display for this patient.   Past Surgical History:  Procedure Laterality Date  . TONSILLECTOMY      OB History    No data available       Home Medications    Prior to Admission medications   Medication Sig Start Date End Date Taking? Authorizing Provider  Etonogestrel (IMPLANON [place]) Inject 1 Units into the skin See admin instructions. Every 3 years    [provider]  ibuprofen (ADVIL,MOTRIN) 800 MG tablet Take 1 tablet (800 mg total) by mouth 3 (three) times daily. Patient not taking: Reported on 11/12/2016 07/14/16   Fayrene Helper, PA-C  ondansetron (ZOFRAN ODT) 4 MG disintegrating tablet Take 1 tablet (4 mg total) by mouth every 8 (eight) hours as needed for nausea or vomiting. Patient not taking: Reported on 11/12/2016 12/18/14   Angelina Ok, MD  penicillin v potassium (VEETID) 500 MG tablet Take 1 tablet (500 mg total) by mouth 3 (three) times daily. Patient not taking: Reported on 11/12/2016 07/14/16   Fayrene Helper, PA-C    Family History No family history on file.  Social History Social  History  Substance Use Topics  . Smoking status: Current Every Day Smoker  . Smokeless tobacco: Current User  . Alcohol use No     Allergies   Patient has no known allergies.   Review of Systems Review of Systems  Constitutional: Positive for chills. Negative for fever.  Respiratory: Negative for shortness of breath and wheezing.   Cardiovascular: Negative for chest pain.  Gastrointestinal: Positive for abdominal pain, nausea and vomiting. Negative for abdominal distention, blood in stool, constipation and diarrhea.  Genitourinary: Negative for difficulty urinating, dysuria, frequency, hematuria and urgency.  Musculoskeletal: Negative for back pain.     Physical Exam Updated Vital Signs BP 118/84   Pulse 69   Temp 99.1 F (37.3 C) (Oral)   Resp 12   LMP 11/10/2016 Comment: neg preg test 11/12/2016  SpO2 99%   Physical Exam  Constitutional: She is oriented to person, place, and time. She appears well-developed and well-nourished. No distress.  HENT:  Head: Normocephalic and atraumatic.  Nose: Nose normal.  Eyes: Conjunctivae and EOM are normal.  Neck: Normal range of motion.  Cardiovascular: Normal rate, regular rhythm, normal heart sounds and intact distal pulses.   No murmur heard. Pulmonary/Chest: Effort normal and breath sounds normal. No respiratory distress. She has no wheezes.  Abdominal: Soft. Bowel sounds are normal. She exhibits no distension. There is tenderness (diffusely). There is no rebound and  no guarding.  Musculoskeletal: Normal range of motion.  Neurological: She is alert and oriented to person, place, and time.  Having shaking episodes but can be distracted and responds to voice and touch during these episodes.  Skin: Skin is warm. Capillary refill takes less than 2 seconds. She is diaphoretic.  Psychiatric:  Is tearful     ED Treatments / Results  Labs (all labs ordered are listed, but only abnormal results are displayed) Labs Reviewed  CBC  - Abnormal; Notable for the following:       Result Value   WBC 20.2 (*)    Hemoglobin 15.2 (*)    All other components within normal limits  COMPREHENSIVE METABOLIC PANEL - Abnormal; Notable for the following:    CO2 20 (*)    ALT 12 (*)    All other components within normal limits  URINALYSIS, ROUTINE W REFLEX MICROSCOPIC  POC URINE PREG, ED    EKG  EKG Interpretation None       Radiology US Transvaginal Non-ob  Result Date: 11/13/2016 CLINICAL DATA:  Progressive abdominal/pelvic pain. EXAM: TRANSABDOMINAL AND TRANSVAGINAL ULTRASOUND OF PELVIS DOPPLER ULTRASOUND OF OVARIES TECHNIQUE: Both transabdominal and transvaginal ultrasound examinations of the pelvis were performed. Transabdominal technique was performed for global imaging of the pelvis including uterus, ovaries, adnexal regions, and pelvic cul-de-sac. It was necessary to proceed with endovaginal exam following the transabdominal exam to visualize the uterus and ovaries. Color and duplex Doppler ultrasound was utilized to evaluate blood flow to the ovaries. COMPARISON:  Ultrasound 11/12/2016. FINDINGS: Uterus Measurements: 7.4 x 3.5 x 4.4 cm. No fibroids or other mass visualized. Endometrium Thickness: 2 mm.  No focal abnormality visualized. Right ovary Measurements: 3.0 x 2.3 x 2.3 cm. 2.5 x 1.8 x 1.8 cm simple cyst again noted. Similar finding on prior exam. Again this is most likely a dominant follicle . Left ovary Measurements: 2.6 x 2.1 x 1.7 cm. No focal left ovarian// adnexal abnormality identified . Pulsed Doppler evaluation of both ovaries demonstrates normal low-resistance arterial and venous waveforms. Other findings Small amount of free fluid again noted. IMPRESSION: 1. Dominant follicular cyst again noted the right ovary without interim change. Small amount of free fluid again noted. 2. No other significant abnormality identified. No evidence of ovarian torsion. Electronically Signed   By: Maisie Fus  Register   On:  11/13/2016 09:52   US Transvaginal Non-ob  Result Date: 11/12/2016 CLINICAL DATA:  Lower abdominal/ pelvic pain EXAM: TRANSABDOMINAL AND TRANSVAGINAL ULTRASOUND OF PELVIS TECHNIQUE: Study was performed transabdominally to optimize pelvic field of view evaluation and transvaginally to optimize internal visceral architecture evaluation. COMPARISON:  CT abdomen and pelvis November 12, 2016 FINDINGS: Uterus Measurements: 8.5 x 4.3 x 5.2 cm. No fibroids or other mass visualized. Endometrium Thickness: 2 mm.  No focal abnormality visualized. Right ovary Measurements: 3.2 x 2.7 x 2.5 cm. There is a cystic area, likely dominant follicle, arising in the right ovary measuring 2.3 x 2.3 x 2.1 cm. No other right-sided pelvic mass. There is a small amount of adjacent free pelvic fluid. Left ovary Measurements: 2.7 x 1.6 x 2.0 cm. Normal appearance/no adnexal mass. Other findings Small amount of free pelvic fluid adjacent to right ovary. IMPRESSION: Small cystic area, likely dominant follicle, and right ovary with a small amount of adjacent fluid. Question recent ovarian cyst leakage. Study otherwise unremarkable. Electronically Signed   By: Bretta Bang III M.D.   On: 11/12/2016 19:52   US Pelvis Complete  Result Date: 11/13/2016 CLINICAL  DATA:  Progressive abdominal/pelvic pain. EXAM: TRANSABDOMINAL AND TRANSVAGINAL ULTRASOUND OF PELVIS DOPPLER ULTRASOUND OF OVARIES TECHNIQUE: Both transabdominal and transvaginal ultrasound examinations of the pelvis were performed. Transabdominal technique was performed for global imaging of the pelvis including uterus, ovaries, adnexal regions, and pelvic cul-de-sac. It was necessary to proceed with endovaginal exam following the transabdominal exam to visualize the uterus and ovaries. Color and duplex Doppler ultrasound was utilized to evaluate blood flow to the ovaries. COMPARISON:  Ultrasound 11/12/2016. FINDINGS: Uterus Measurements: 7.4 x 3.5 x 4.4 cm. No fibroids or other  mass visualized. Endometrium Thickness: 2 mm.  No focal abnormality visualized. Right ovary Measurements: 3.0 x 2.3 x 2.3 cm. 2.5 x 1.8 x 1.8 cm simple cyst again noted. Similar finding on prior exam. Again this is most likely a dominant follicle . Left ovary Measurements: 2.6 x 2.1 x 1.7 cm. No focal left ovarian// adnexal abnormality identified . Pulsed Doppler evaluation of both ovaries demonstrates normal low-resistance arterial and venous waveforms. Other findings Small amount of free fluid again noted. IMPRESSION: 1. Dominant follicular cyst again noted the right ovary without interim change. Small amount of free fluid again noted. 2. No other significant abnormality identified. No evidence of ovarian torsion. Electronically Signed   By: Maisie Fushomas  Register   On: 11/13/2016 09:52   [place] Pelvis Complete  Result Date: 11/12/2016 CLINICAL DATA:  Lower abdominal/ pelvic pain EXAM: TRANSABDOMINAL AND TRANSVAGINAL ULTRASOUND OF PELVIS TECHNIQUE: Study was performed transabdominally to optimize pelvic field of view evaluation and transvaginally to optimize internal visceral architecture evaluation. COMPARISON:  CT abdomen and pelvis November 12, 2016 FINDINGS: Uterus Measurements: 8.5 x 4.3 x 5.2 cm. No fibroids or other mass visualized. Endometrium Thickness: 2 mm.  No focal abnormality visualized. Right ovary Measurements: 3.2 x 2.7 x 2.5 cm. There is a cystic area, likely dominant follicle, arising in the right ovary measuring 2.3 x 2.3 x 2.1 cm. No other right-sided pelvic mass. There is a small amount of adjacent free pelvic fluid. Left ovary Measurements: 2.7 x 1.6 x 2.0 cm. Normal appearance/no adnexal mass. Other findings Small amount of free pelvic fluid adjacent to right ovary. IMPRESSION: Small cystic area, likely dominant follicle, and right ovary with a small amount of adjacent fluid. Question recent ovarian cyst leakage. Study otherwise unremarkable. Electronically Signed   By: Bretta BangWilliam  Woodruff III M.D.    On: 11/12/2016 19:52   Ct Abdomen Pelvis W Contrast  Result Date: 11/12/2016 CLINICAL DATA:  Abdominal pain and vomiting starting this morning. Reduced appetite. EXAM: CT ABDOMEN AND PELVIS WITH CONTRAST TECHNIQUE: Multidetector CT imaging of the abdomen and pelvis was performed using the standard protocol following bolus administration of intravenous contrast. CONTRAST:  80mL ISOVUE-300 IOPAMIDOL (ISOVUE-300) INJECTION 61% COMPARISON:  Chest radiograph from 09/15/2016 FINDINGS: Lower chest: Unremarkable Hepatobiliary: Unremarkable Pancreas: Unremarkable Spleen: Unremarkable Adrenals/Urinary Tract: Adrenal glands normal. 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 Stomach/Bowel: The appendix appears normal particularly on the multiplanar imaging. Terminal ileum unremarkable. Borderline prominent caliber of the cecum but no cecal wall thickening. No dilated bowel. Several air-fluid levels are present in nondilated mid abdominal small bowel. Vascular/Lymphatic: Unremarkable Reproductive: 2.7 by 1.7 by 2.2 cm cystic lesion in the right ovary. Indistinctness of tissue planes especially along the ovaries and parametrial region, with a lesser degree of stranding extending in the right lower quadrant omentum. Uterine contour normal. No specific left ovarian abnormality. Other: As noted above  there is stranding especially in the pelvis causing obscuration of margins of the uterus and adnexa. Is potentially a small amount of free pelvic fluid in the cul-de-sac. Musculoskeletal: Sacral Tarlov cause thinning of the sacrum. IMPRESSION: 1. Stranding in the pelvis potentially with a small amount of free pelvic fluid. There is some subtle stranding in the lower omentum, right greater the left. There is also a 2.7 cm right ovarian cystic lesion which could conceivably be a source for inflammation. I note that the patient's pregnancy test is negative  today and accordingly ectopic pregnancy is not suspected. This could be a complex cyst. I am more skeptical of tubo-ovarian abscess given the relatively thin walls, but pelvic sonography may be warranted for further workup. 2. The appendix does not appear inflamed. 3. 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 4. 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 Electronically Signed   By: Gaylyn Rong M.D.   On: 11/12/2016 17:10   [place] Art/ven Flow Abd Pelv Doppler  Result Date: 11/13/2016 CLINICAL DATA:  Progressive abdominal/pelvic pain. EXAM: TRANSABDOMINAL AND TRANSVAGINAL ULTRASOUND OF PELVIS DOPPLER ULTRASOUND OF OVARIES TECHNIQUE: Both transabdominal and transvaginal ultrasound examinations of the pelvis were performed. Transabdominal technique was performed for global imaging of the pelvis including uterus, ovaries, adnexal regions, and pelvic cul-de-sac. It was necessary to proceed with endovaginal exam following the transabdominal exam to visualize the uterus and ovaries. Color and duplex Doppler ultrasound was utilized to evaluate blood flow to the ovaries. COMPARISON:  Ultrasound 11/12/2016. FINDINGS: Uterus Measurements: 7.4 x 3.5 x 4.4 cm. No fibroids or other mass visualized. Endometrium Thickness: 2 mm.  No focal abnormality visualized. Right ovary Measurements: 3.0 x 2.3 x 2.3 cm. 2.5 x 1.8 x 1.8 cm simple cyst again noted. Similar finding on prior exam. Again this is most likely a dominant follicle . Left ovary Measurements: 2.6 x 2.1 x 1.7 cm. No focal left ovarian// adnexal abnormality identified . Pulsed Doppler evaluation of both ovaries demonstrates normal low-resistance arterial and venous waveforms. Other findings Small amount of free fluid again noted. IMPRESSION: 1. Dominant follicular cyst again noted the right ovary without interim change. Small amount of free fluid again noted. 2. No other significant abnormality  identified. No evidence of ovarian torsion. Electronically Signed   By: Maisie Fus  Register   On: 11/13/2016 09:52    Procedures Procedures (including critical care time)  Medications Ordered in ED Medications - No data to display   Initial Impression / Assessment and Plan / ED Course  I have reviewed the triage vital signs and the nursing notes.  Pertinent labs & imaging results that were available during my care of the patient were reviewed by me and considered in my medical decision making (see chart for details).   Patient is 21 yo F who was seen in ED yesterday for abdominal pain and presents today with worsening pain and nausea. She was treated with 2g flagyl yesterday in ED for trich and ceftriaxone/azithromycin prophylactically for GC/chlamydia. Was also found to have ovarian cyst on imaging. Given acute worsening obtained repeat imaging was unchanged still showing dominant ovarian follicle without abscess. Labs are unchanged with WBC still elevated at 20. Spoke with OBGYN who recommend outpatient treatment with doxycycline prophylactically for PID since too early for patient to have failed outpatient management.   Final Clinical Impressions(s) / ED Diagnoses   Final diagnoses:  Cyst of right ovary    New  Prescriptions Discharge Medication List as of 11/13/2016 11:44 AM    START taking these medications   Details  doxycycline (VIBRAMYCIN) 50 MG capsule Take 2 capsules (100 mg total) by mouth 2 (two) times daily., Starting Wed 11/13/2016, Until Wed 11/27/2016, Print    ondansetron (ZOFRAN) 4 MG tablet Take 1 tablet (4 mg total) by mouth every 8 (eight) hours as needed for nausea or vomiting., Starting Wed 11/13/2016, Until Thu 11/13/2017, Print         Leland Her, DO 11/13/16 1205    Abelino Derrick, MD 11/14/16 1527

## 2016-11-13 NOTE — ED Notes (Signed)
Returned from ultrasound.

## 2016-11-13 NOTE — ED Notes (Signed)
Pt ambulated to restroom without difficulty

## 2016-11-14 ENCOUNTER — Encounter (HOSPITAL_COMMUNITY): Payer: Self-pay

## 2016-11-14 ENCOUNTER — Emergency Department (HOSPITAL_COMMUNITY)
Admission: EM | Admit: 2016-11-14 | Discharge: 2016-11-14 | Disposition: A | Discharge status: Home / Self Care | Payer: Medicaid Other | Attending: Emergency Medicine | Admitting: Emergency Medicine

## 2016-11-14 DIAGNOSIS — Z760 Encounter for issue of repeat prescription: Secondary | ICD-10-CM | POA: Insufficient documentation

## 2016-11-14 DIAGNOSIS — F17200 Nicotine dependence, unspecified, uncomplicated: Secondary | ICD-10-CM | POA: Insufficient documentation

## 2016-11-14 DIAGNOSIS — N83201 Unspecified ovarian cyst, right side: Secondary | ICD-10-CM | POA: Insufficient documentation

## 2016-11-14 DIAGNOSIS — R112 Nausea with vomiting, unspecified: Secondary | ICD-10-CM

## 2016-11-14 MED ORDER — ONDANSETRON 4 MG PO TBDP: 4.0000 mg | ORAL_TABLET | Freq: Three times a day (TID) | ORAL | 0 refills | Status: DC | PRN

## 2016-11-14 MED ORDER — IBUPROFEN 800 MG PO TABS: 800.0000 mg | ORAL_TABLET | Freq: Three times a day (TID) | ORAL | 0 refills | Status: DC

## 2016-11-14 MED ORDER — DOXYCYCLINE HYCLATE 100 MG PO TABS
100.0000 mg | ORAL_TABLET | Freq: Once | ORAL | Status: AC
  Administered 2016-11-14: 100 mg via ORAL
  Filled 2016-11-14: qty 1

## 2016-11-14 MED ORDER — LEVETIRACETAM 500 MG PO TABS: 500.0000 mg | ORAL_TABLET | Freq: Two times a day (BID) | ORAL | 0 refills | Status: DC

## 2016-11-14 MED ORDER — OXYCODONE-ACETAMINOPHEN 5-325 MG PO TABS
1.0000 | ORAL_TABLET | Freq: Once | ORAL | Status: AC
  Administered 2016-11-14: 1 via ORAL
  Filled 2016-11-14: qty 1

## 2016-11-14 MED ORDER — DOXYCYCLINE HYCLATE 50 MG PO CAPS: 100.0000 mg | ORAL_CAPSULE | Freq: Two times a day (BID) | ORAL | 0 refills | Status: AC

## 2016-11-14 MED ORDER — ONDANSETRON 4 MG PO TBDP
4.0000 mg | ORAL_TABLET | Freq: Once | ORAL | Status: AC
  Administered 2016-11-14: 4 mg via ORAL
  Filled 2016-11-14: qty 1

## 2016-11-14 NOTE — ED Notes (Signed)
Bed: [id number] Expected date:  Expected time:  Means of arrival:  Comments: EMS 21 y/o seizure, abd pain

## 2016-11-14 NOTE — Care Management Note (Signed)
Case Management Note  Patient Details  Name: Yvonne Waller MRN: [id number] Date of Birth: Sep 09, 1995  Subjective/Objective:                  Seizures   Action/Plan: ED CM spoke with the patient at the bedside. She reports this is her 3rd visit to the ED due to abdominal pain, nausea and vomiting. States she has a supply of expired seizure medication at home which her father told her not to take. She has not taken it. She is unable to provide the name of the medication. She did not get her doxycycline or Zofran filled after being discharged from the ED on yesterday. She states she did not have transportation to the pharmacy. Reports she had Medicaid until her mother "took" her 2 children away. She does not currently have a PCP. States her seizure medication was prescribed when she had Medicaid and was able to see a PCP. CM [facility] the [facility] and encouraged her to call tomorrow. The patient's mother arrived during the conversation. She states the patient has not followed up with physicians in the past when she has been instructed to see a physician. The patient [facility] Patient states she works so much that she forgets whether or not she ate or forgets to eat when discussing her health with her mother. CM provided the patient with a MATCH letter for medication assistance and the list of participating pharmacies. The patient denies having any questions.   Expected Discharge Date:    11/14/16           Expected Discharge Plan:  Home/Self Care  In-House Referral:     Discharge planning Services  [facility]  Post Acute Care Choice:    Choice offered to:  NA  DME Arranged:  N/A DME Agency:  NA  HH Arranged:  NA HH Agency:  NA  Status of Service:  Completed, signed off  If discussed at Long Length of Stay Meetings, dates discussed:    Additional  Comments:  Antony HasteBennett, Diandre Merica Harris, RN 11/14/2016, 7:15 PM

## 2016-11-14 NOTE — ED Notes (Signed)
ED Provider at bedside. 

## 2016-11-14 NOTE — ED Notes (Signed)
CASE MANAGER AT BEDSIDE

## 2016-11-14 NOTE — ED Notes (Signed)
Case manager at bedside again

## 2016-11-14 NOTE — ED Provider Notes (Signed)
[facility]-EMERGENCY DEPT Provider Note   CSN: [id number] Arrival date & time: 11/14/16  1657     History   Chief Complaint Chief Complaint  Patient presents with  . Seizures    HPI Yvonne Waller is a 21 y.o. female.  HPI  Pt with hx of seizure disorder, bipolar recently diagnosed ovarian cyst presenting with c/o ongoing pain and vomiting.  She has not gotten her medications filled and states she has no money to get her medications.   She states she has had continued vomiting but has not taken any zofran at home due to not having medications.  Given rx yesterday for doxycycline but has not gotten this filled either- per chart review this was recommended by OB/GYN.  She states she also has not been able to get her seizure medications for the past 2 months- she has keppra but it is expired for the past 2 months so she has not taken.  There are no other associated systemic symptoms, there are no other alleviating or modifying factors.   Past Medical History:  Diagnosis Date  . Bipolar 1 disorder (HCC)   . Depression   . Herpes genitalis   . Hydrocephalus   . Seizures (HCC)     There are no active problems to display for this patient.   Past Surgical History:  Procedure Laterality Date  . TONSILLECTOMY      OB History    No data available       Home Medications    Prior to Admission medications   Medication Sig Start Date End Date Taking? Authorizing Provider  Etonogestrel (IMPLANON [place]) Inject 1 Units into the skin See admin instructions. Every 3 years   Yes [provider]  doxycycline (VIBRAMYCIN) 50 MG capsule Take 2 capsules (100 mg total) by mouth 2 (two) times daily. 11/14/16 11/28/16  Phillis HaggisMabe, Shadell Brenn L, MD  ibuprofen (ADVIL,MOTRIN) 800 MG tablet Take 1 tablet (800 mg total) by mouth 3 (three) times daily. 11/14/16   Isham Smitherman, Latanya MaudlinMartha L, MD  levETIRAcetam (KEPPRA) 500 MG tablet Take 1 tablet (500 mg total) by mouth 2 (two) times daily. 11/14/16   Glorie Dowlen, Latanya MaudlinMartha L, MD    ondansetron (ZOFRAN ODT) 4 MG disintegrating tablet Take 1 tablet (4 mg total) by mouth every 8 (eight) hours as needed for nausea or vomiting. 11/14/16   Shatisha Falter, Latanya MaudlinMartha L, MD  ondansetron (ZOFRAN) 4 MG tablet Take 1 tablet (4 mg total) by mouth every 8 (eight) hours as needed for nausea or vomiting. Patient not taking: Reported on 11/14/2016 11/13/16 11/13/17  Leland HerYoo, Elsia J, DO  penicillin v potassium (VEETID) 500 MG tablet Take 1 tablet (500 mg total) by mouth 3 (three) times daily. Patient not taking: Reported on 11/12/2016 07/14/16   Fayrene Helperran, [place], PA-C    Family History No family history on file.  Social History Social History  Substance Use Topics  . Smoking status: Current Every Day Smoker  . Smokeless tobacco: Current User  . Alcohol use No     Allergies   Patient has no known allergies.   Review of Systems Review of Systems  ROS reviewed and all otherwise negative except for mentioned in HPI   Physical Exam Updated Vital Signs BP 126/76   Pulse 82   Temp 98.6 F (37 C) (Oral)   Resp (!) 22   Ht 5\' 3"  (1.6 m)   Wt 53.1 kg (117 lb)   LMP 11/10/2016 Comment: neg preg test 11/12/2016  SpO2 98%  BMI 20.73 kg/m  Vitals reviewed Physical Exam  Physical Examination: General appearance - alert, well appearing, anxious and in no distress Mental status - alert, oriented to person, place, and time Eyes - pupils equal and reactive, extraocular eye movements intact Mouth - mucous membranes moist, pharynx normal without lesions Neck - supple, no significant adenopathy Chest - clear to auscultation, no wheezes, rales or rhonchi, symmetric air entry Heart - normal rate, regular rhythm, normal S1, S2, no murmurs, rubs, clicks or gallops Abdomen - soft, nontender, nondistended, no masses or organomegaly Neurological - alert, oriented x 3, cranial nerves 2-12 tested and intact, strength 5/5 in extremities x 4, sensation intact Extremities - peripheral pulses normal, no pedal edema, no  clubbing or cyanosis Skin - normal coloration and turgor, no rashes   ED Treatments / Results  Labs (all labs ordered are listed, but only abnormal results are displayed) Labs Reviewed - No data to display  EKG  EKG Interpretation None       Radiology US Transvaginal Non-ob  Result Date: 11/13/2016 CLINICAL DATA:  Progressive abdominal/pelvic pain. EXAM: TRANSABDOMINAL AND TRANSVAGINAL ULTRASOUND OF PELVIS DOPPLER ULTRASOUND OF OVARIES TECHNIQUE: Both transabdominal and transvaginal ultrasound examinations of the pelvis were performed. Transabdominal technique was performed for global imaging of the pelvis including uterus, ovaries, adnexal regions, and pelvic cul-de-sac. It was necessary to proceed with endovaginal exam following the transabdominal exam to visualize the uterus and ovaries. Color and duplex Doppler ultrasound was utilized to evaluate blood flow to the ovaries. COMPARISON:  Ultrasound 11/12/2016. FINDINGS: Uterus Measurements: 7.4 x 3.5 x 4.4 cm. No fibroids or other mass visualized. Endometrium Thickness: 2 mm.  No focal abnormality visualized. Right ovary Measurements: 3.0 x 2.3 x 2.3 cm. 2.5 x 1.8 x 1.8 cm simple cyst again noted. Similar finding on prior exam. Again this is most likely a dominant follicle . Left ovary Measurements: 2.6 x 2.1 x 1.7 cm. No focal left ovarian// adnexal abnormality identified . Pulsed Doppler evaluation of both ovaries demonstrates normal low-resistance arterial and venous waveforms. Other findings Small amount of free fluid again noted. IMPRESSION: 1. Dominant follicular cyst again noted the right ovary without interim change. Small amount of free fluid again noted. 2. No other significant abnormality identified. No evidence of ovarian torsion. Electronically Signed   By: Maisie Fus  Register   On: 11/13/2016 09:52   US Pelvis Complete  Result Date: 11/13/2016 CLINICAL DATA:  Progressive abdominal/pelvic pain. EXAM: TRANSABDOMINAL AND TRANSVAGINAL  ULTRASOUND OF PELVIS DOPPLER ULTRASOUND OF OVARIES TECHNIQUE: Both transabdominal and transvaginal ultrasound examinations of the pelvis were performed. Transabdominal technique was performed for global imaging of the pelvis including uterus, ovaries, adnexal regions, and pelvic cul-de-sac. It was necessary to proceed with endovaginal exam following the transabdominal exam to visualize the uterus and ovaries. Color and duplex Doppler ultrasound was utilized to evaluate blood flow to the ovaries. COMPARISON:  Ultrasound 11/12/2016. FINDINGS: Uterus Measurements: 7.4 x 3.5 x 4.4 cm. No fibroids or other mass visualized. Endometrium Thickness: 2 mm.  No focal abnormality visualized. Right ovary Measurements: 3.0 x 2.3 x 2.3 cm. 2.5 x 1.8 x 1.8 cm simple cyst again noted. Similar finding on prior exam. Again this is most likely a dominant follicle . Left ovary Measurements: 2.6 x 2.1 x 1.7 cm. No focal left ovarian// adnexal abnormality identified . Pulsed Doppler evaluation of both ovaries demonstrates normal low-resistance arterial and venous waveforms. Other findings Small amount of free fluid again noted. IMPRESSION: 1. Dominant follicular cyst again noted  the right ovary without interim change. Small amount of free fluid again noted. 2. No other significant abnormality identified. No evidence of ovarian torsion. Electronically Signed   By: Maisie Fus  Register   On: 11/13/2016 09:52   [place] Art/ven Flow Abd Pelv Doppler  Result Date: 11/13/2016 CLINICAL DATA:  Progressive abdominal/pelvic pain. EXAM: TRANSABDOMINAL AND TRANSVAGINAL ULTRASOUND OF PELVIS DOPPLER ULTRASOUND OF OVARIES TECHNIQUE: Both transabdominal and transvaginal ultrasound examinations of the pelvis were performed. Transabdominal technique was performed for global imaging of the pelvis including uterus, ovaries, adnexal regions, and pelvic cul-de-sac. It was necessary to proceed with endovaginal exam following the transabdominal exam to visualize the  uterus and ovaries. Color and duplex Doppler ultrasound was utilized to evaluate blood flow to the ovaries. COMPARISON:  Ultrasound 11/12/2016. FINDINGS: Uterus Measurements: 7.4 x 3.5 x 4.4 cm. No fibroids or other mass visualized. Endometrium Thickness: 2 mm.  No focal abnormality visualized. Right ovary Measurements: 3.0 x 2.3 x 2.3 cm. 2.5 x 1.8 x 1.8 cm simple cyst again noted. Similar finding on prior exam. Again this is most likely a dominant follicle . Left ovary Measurements: 2.6 x 2.1 x 1.7 cm. No focal left ovarian// adnexal abnormality identified . Pulsed Doppler evaluation of both ovaries demonstrates normal low-resistance arterial and venous waveforms. Other findings Small amount of free fluid again noted. IMPRESSION: 1. Dominant follicular cyst again noted the right ovary without interim change. Small amount of free fluid again noted. 2. No other significant abnormality identified. No evidence of ovarian torsion. Electronically Signed   By: Maisie Fus  Register   On: 11/13/2016 09:52    Procedures Procedures (including critical care time)  Medications Ordered in ED Medications  oxyCODONE-acetaminophen (PERCOCET/ROXICET) 5-325 MG per tablet 1 tablet (1 tablet Oral Given 11/14/16 1855)  ondansetron (ZOFRAN-ODT) disintegrating tablet 4 mg (4 mg Oral Given 11/14/16 1855)  doxycycline (VIBRA-TABS) tablet 100 mg (100 mg Oral Given 11/14/16 1857)     Initial Impression / Assessment and Plan / ED Course  I have reviewed the triage vital signs and the nursing notes.  Pertinent labs & imaging results that were available during my care of the patient were reviewed by me and considered in my medical decision making (see chart for details).    6:42 PMn case manager has seen patient.  Her mother is now here and states she will pay for doxycycline.  Patient had not advised mother about the trouble she was having.  Case manager will give letter so that she has $3 copay for keppra and zofran.  Patient  Lowella Grip that this cannot be re-used for 1 year.  Given referral to [facility] and there she can apply for orange card.    8:12 PM pt has tolerated po fluids after zofran in the ED.  9:21 PM I have talked with patient and mother who is at bedside- mother is very upset and requesting CT scan, repeat ultrasound, blood work.  I have discussed with her the results of the tests that were done on the last 2 days of ED visits.  Today with the meds given she feels much improved and has been able to eat and drink.  She had not gotten any meds filled after the 2 prior visits.  Case management has seen her as above.  Mother continues to request head CT which I have discussed with her is not indicated emergently in the ED tonight.  Pt has normal neuro exam, she is anxious but has not had any seizure activity- she  had one episode of tremulousness and anxiety- nurse was able to redirect her- she did not lose consciousness- no seizure activity.  I have d/w mom that our goal is to get patient access to the correct medications, access to follwoup care through [facility], referral to GYN and may need ongoing neurology followup for her history of seizures.  Mother does not seem to understand although I have discussed the above with her at length.  Patient verbalizes understanding.  Nursing has talked with mom as well, also charge nurse has talked with her as well.  Patient is stable for discharge.       Final Clinical Impressions(s) / ED Diagnoses   Final diagnoses:  Cyst of right ovary  Non-intractable vomiting with nausea, unspecified vomiting type  Medication refill    New Prescriptions New Prescriptions   LEVETIRACETAM (KEPPRA) 500 MG TABLET    Take 1 tablet (500 mg total) by mouth 2 (two) times daily.     Phillis Haggis, MD 11/14/16 [phone]

## 2016-11-14 NOTE — ED Triage Notes (Signed)
Per EMS: pt was seen here yesterday. Pain in abd has increased since then.Seizure like activity occurred en route to this facility. EMS denied post ictal phase and A/Ox4 post seizure. Pt states she is unable to pay for prescription given upon d/c yesterday, and unable to afford her seizure medication. EMS VS: HR 80, 111/76, 100% room air, CBG 64- oral glucose given, cbg 66 post oral glucose.

## 2016-11-14 NOTE — Discharge Instructions (Signed)
Return to the ED with any concerns including vomiting and not able to keep down liquids or your medications, abdominal pain especially if it localizes to the right lower abdomen, fever or chills, and decreased urine output, decreased level of alertness or lethargy, or any other alarming symptoms.  °

## 2019-09-15 ENCOUNTER — Other Ambulatory Visit: Payer: Self-pay

## 2019-09-15 DIAGNOSIS — K5900 Constipation, unspecified: Secondary | ICD-10-CM | POA: Insufficient documentation

## 2019-09-15 DIAGNOSIS — F17200 Nicotine dependence, unspecified, uncomplicated: Secondary | ICD-10-CM | POA: Insufficient documentation

## 2019-09-15 DIAGNOSIS — R1013 Epigastric pain: Secondary | ICD-10-CM | POA: Insufficient documentation

## 2019-09-15 DIAGNOSIS — R1012 Left upper quadrant pain: Secondary | ICD-10-CM | POA: Insufficient documentation

## 2019-09-16 ENCOUNTER — Emergency Department (HOSPITAL_COMMUNITY)
Admission: EM | Admit: 2019-09-16 | Discharge: 2019-09-16 | Disposition: A | Discharge status: Home / Self Care | Payer: Self-pay | Attending: Emergency Medicine | Admitting: Emergency Medicine

## 2019-09-16 ENCOUNTER — Other Ambulatory Visit: Payer: Self-pay

## 2019-09-16 ENCOUNTER — Encounter (HOSPITAL_COMMUNITY): Payer: Self-pay

## 2019-09-16 ENCOUNTER — Emergency Department (HOSPITAL_COMMUNITY): Payer: Self-pay

## 2019-09-16 DIAGNOSIS — D72829 Elevated white blood cell count, unspecified: Secondary | ICD-10-CM

## 2019-09-16 DIAGNOSIS — K5900 Constipation, unspecified: Secondary | ICD-10-CM

## 2019-09-16 DIAGNOSIS — J189 Pneumonia, unspecified organism: Secondary | ICD-10-CM

## 2019-09-16 HISTORY — DX: Elevated white blood cell count, unspecified: D72.829

## 2019-09-16 LAB — RAPID URINE DRUG SCREEN, HOSP PERFORMED
Amphetamines: NOT DETECTED
Barbiturates: NOT DETECTED
Benzodiazepines: NOT DETECTED
Cocaine: NOT DETECTED
Opiates: NOT DETECTED
Tetrahydrocannabinol: POSITIVE — AB

## 2019-09-16 LAB — URINALYSIS, ROUTINE W REFLEX MICROSCOPIC
Bilirubin Urine: NEGATIVE
Glucose, UA: NEGATIVE mg/dL
Hgb urine dipstick: NEGATIVE
Ketones, ur: 20 mg/dL — AB
Leukocytes,Ua: NEGATIVE
Nitrite: NEGATIVE
Protein, ur: 30 mg/dL — AB
Specific Gravity, Urine: <1.005 — ABNORMAL LOW (ref 1.005–1.030)
pH: 8 (ref 5.0–8.0)

## 2019-09-16 LAB — CBC WITH DIFFERENTIAL/PLATELET
Abs Immature Granulocytes: 0.54 10*3/uL — ABNORMAL HIGH (ref 0.00–0.07)
Basophils Absolute: 0.2 10*3/uL — ABNORMAL HIGH (ref 0.0–0.1)
Basophils Relative: 0 %
Eosinophils Absolute: 0 10*3/uL (ref 0.0–0.5)
Eosinophils Relative: 0 %
HCT: 49.6 % — ABNORMAL HIGH (ref 36.0–46.0)
Hemoglobin: 16 g/dL — ABNORMAL HIGH (ref 12.0–15.0)
Immature Granulocytes: 1 %
Lymphocytes Relative: 8 %
Lymphs Abs: 3.3 10*3/uL (ref 0.7–4.0)
MCH: 32.9 pg (ref 26.0–34.0)
MCHC: 32.3 g/dL (ref 30.0–36.0)
MCV: 101.8 fL — ABNORMAL HIGH (ref 80.0–100.0)
Monocytes Absolute: 2.8 10*3/uL — ABNORMAL HIGH (ref 0.1–1.0)
Monocytes Relative: 7 %
Neutro Abs: 32.8 10*3/uL — ABNORMAL HIGH (ref 1.7–7.7)
Neutrophils Relative %: 84 %
Platelets: 254 10*3/uL (ref 150–400)
RBC: 4.87 MIL/uL (ref 3.87–5.11)
RDW: 13.2 % (ref 11.5–15.5)
WBC: 39.7 10*3/uL — ABNORMAL HIGH (ref 4.0–10.5)
nRBC: 0 % (ref 0.0–0.2)

## 2019-09-16 LAB — I-STAT CHEM 8, ED
BUN: 12 mg/dL (ref 6–20)
Calcium, Ion: 1.07 mmol/L — ABNORMAL LOW (ref 1.15–1.40)
Chloride: 111 mmol/L (ref 98–111)
Creatinine, Ser: 0.5 mg/dL (ref 0.44–1.00)
Glucose, Bld: 99 mg/dL (ref 70–99)
HCT: 47 % — ABNORMAL HIGH (ref 36.0–46.0)
Hemoglobin: 16 g/dL — ABNORMAL HIGH (ref 12.0–15.0)
Potassium: 4.7 mmol/L (ref 3.5–5.1)
Sodium: 144 mmol/L (ref 135–145)
TCO2: 20 mmol/L — ABNORMAL LOW (ref 22–32)

## 2019-09-16 LAB — I-STAT BETA HCG BLOOD, ED (MC, WL, AP ONLY): I-stat hCG, quantitative: <5 m[IU]/mL (ref <5)

## 2019-09-16 MED ORDER — HALOPERIDOL LACTATE 5 MG/ML IJ SOLN
5.0000 mg | Freq: Once | INTRAMUSCULAR | Status: AC
  Administered 2019-09-16: 5 mg via INTRAMUSCULAR
  Filled 2019-09-16: qty 1

## 2019-09-16 MED ORDER — ONDANSETRON HCL 4 MG/2ML IJ SOLN
4.0000 mg | Freq: Once | INTRAMUSCULAR | Status: AC
  Administered 2019-09-16: 4 mg via INTRAVENOUS
  Filled 2019-09-16: qty 2

## 2019-09-16 MED ORDER — FAMOTIDINE 20 MG PO TABS
20.0000 mg | ORAL_TABLET | Freq: Two times a day (BID) | ORAL | 0 refills | Status: DC
Start: 2019-09-16 — End: 2019-11-19

## 2019-09-16 MED ORDER — LIDOCAINE VISCOUS HCL 2 % MT SOLN
15.0000 mL | Freq: Once | OROMUCOSAL | Status: AC
  Administered 2019-09-16: 15 mL via ORAL
  Filled 2019-09-16: qty 15

## 2019-09-16 MED ORDER — HALOPERIDOL LACTATE 5 MG/ML IJ SOLN
5.0000 mg | Freq: Once | INTRAMUSCULAR | Status: AC
  Administered 2019-09-16: 5 mg via INTRAVENOUS
  Filled 2019-09-16: qty 1

## 2019-09-16 MED ORDER — AMOXICILLIN-POT CLAVULANATE 400-57 MG/5ML PO SUSR: 875.0000 mg | Freq: Two times a day (BID) | ORAL | 0 refills | Status: AC

## 2019-09-16 MED ORDER — AMOXICILLIN-POT CLAVULANATE 875-125 MG PO TABS
1.0000 | ORAL_TABLET | Freq: Once | ORAL | Status: DC
  Filled 2019-09-16: qty 1

## 2019-09-16 MED ORDER — ALUM & MAG HYDROXIDE-SIMETH 200-200-20 MG/5ML PO SUSP
30.0000 mL | Freq: Once | ORAL | Status: AC
  Administered 2019-09-16: 30 mL via ORAL
  Filled 2019-09-16: qty 30

## 2019-09-16 MED ORDER — CEFTRIAXONE SODIUM 1 G IJ SOLR
1.0000 g | Freq: Once | INTRAMUSCULAR | Status: AC
  Administered 2019-09-16: 1 g via INTRAMUSCULAR
  Filled 2019-09-16: qty 10

## 2019-09-16 MED ORDER — KETOROLAC TROMETHAMINE 30 MG/ML IJ SOLN
30.0000 mg | Freq: Once | INTRAMUSCULAR | Status: AC
  Administered 2019-09-16: 30 mg via INTRAVENOUS
  Filled 2019-09-16: qty 1

## 2019-09-16 MED ORDER — SODIUM CHLORIDE (PF) 0.9 % IJ SOLN
INTRAMUSCULAR | Status: AC
  Filled 2019-09-16: qty 50

## 2019-09-16 MED ORDER — STERILE WATER FOR INJECTION IJ SOLN
INTRAMUSCULAR | Status: AC
  Administered 2019-09-16: 2.1 mL
  Filled 2019-09-16: qty 10

## 2019-09-16 MED ORDER — IOHEXOL 300 MG/ML  SOLN
100.0000 mL | Freq: Once | INTRAMUSCULAR | Status: AC | PRN
  Administered 2019-09-16: 100 mL via INTRAVENOUS

## 2019-09-16 NOTE — ED Notes (Signed)
Pt was able to tolerate ice water.

## 2019-09-16 NOTE — ED Notes (Signed)
Recollect for CBC sent to lab.

## 2019-09-16 NOTE — ED Notes (Signed)
Verbalized understanding discharge instructions and follow-up. In no acute distress.  Given and additional warm blanket and wheel to lobby.  Friend picking her up.

## 2019-09-16 NOTE — ED Triage Notes (Signed)
Pt states lower abdominal pain from her ulcers after drinking alcohol. Pain onset 3 hours ago around 2100.

## 2019-09-16 NOTE — ED Provider Notes (Addendum)
[facility] Provider Note   CSN: [id number] Arrival date & time: 09/15/19  2350     History Chief Complaint  Patient presents with  . Abdominal Pain    Yvonne Waller is a 24 y.o. female.  The history is provided by the patient.  Abdominal Pain Pain location:  LUQ and epigastric Pain quality: cramping   Pain radiates to:  Does not radiate Pain severity:  Severe Onset quality:  Sudden Progression:  Unchanged Chronicity:  New Context: alcohol use   Context: not awakening from sleep, not diet changes, not eating, not laxative use, not medication withdrawal, not previous surgeries, not recent illness, not recent sexual activity, not recent travel, not retching, not sick contacts, not suspicious food intake and not trauma   Relieved by:  Nothing Worsened by:  Nothing Ineffective treatments:  None tried Associated symptoms: no anorexia, no belching, no chest pain, no chills, no constipation, no cough, no diarrhea, no dysuria, no fatigue, no fever, no flatus, no hematemesis, no hematochezia, no hematuria, no melena, no nausea, no shortness of breath, no sore throat, no vaginal bleeding, no vaginal discharge and no vomiting   Risk factors: no aspirin use   Patient with a h/o Bipolar and Leukocytosis with baseline white count of 20 presents with LUQ and epigastric pain.  Patient is yelling and thrashing.  Denies urinary or vaginal complaints.  Denies f/c/r. No n/v/d.  No cough.  No SI no HI, no AH no VH.       Past Medical History:  Diagnosis Date  . Bipolar 1 disorder (HCC)   . Depression   . Herpes genitalis   . Hydrocephalus (HCC)   . Seizures (HCC)     There are no problems to display for this patient.   Past Surgical History:  Procedure Laterality Date  . TONSILLECTOMY       OB History   No obstetric history on file.     History reviewed. No pertinent family history.  Social History   Tobacco Use  . Smoking status: Current  Every Day Smoker  . Smokeless tobacco: Current User  Substance Use Topics  . Alcohol use: No  . Drug use: No    Home Medications Prior to Admission medications   Medication Sig Start Date End Date Taking? Authorizing Provider  Etonogestrel (IMPLANON [place]) Inject 1 Units into the skin See admin instructions. Every 3 years    [provider]  ibuprofen (ADVIL,MOTRIN) 800 MG tablet Take 1 tablet (800 mg total) by mouth 3 (three) times daily. 11/14/16   Mabe, Latanya Maudlin, MD  levETIRAcetam (KEPPRA) 500 MG tablet Take 1 tablet (500 mg total) by mouth 2 (two) times daily. 11/14/16   Mabe, Latanya Maudlin, MD  ondansetron (ZOFRAN ODT) 4 MG disintegrating tablet Take 1 tablet (4 mg total) by mouth every 8 (eight) hours as needed for nausea or vomiting. 11/14/16   Mabe, Latanya Maudlin, MD  penicillin v potassium (VEETID) 500 MG tablet Take 1 tablet (500 mg total) by mouth 3 (three) times daily. Patient not taking: Reported on 11/12/2016 07/14/16   Fayrene Helper, PA-C    Allergies    Patient has no known allergies.  Review of Systems   Review of Systems  Constitutional: Negative for chills, fatigue and fever.  HENT: Negative for sore throat.   Respiratory: Negative for cough and shortness of breath.   Cardiovascular: Negative for chest pain.  Gastrointestinal: Positive for abdominal pain. Negative for anorexia, constipation, diarrhea, flatus, hematemesis,  hematochezia, melena, nausea and vomiting.  Genitourinary: Negative for dysuria, hematuria, vaginal bleeding and vaginal discharge.  Neurological: Negative for dizziness.  Psychiatric/Behavioral: Negative for agitation.  All other systems reviewed and are negative.   Physical Exam Updated Vital Signs BP 139/79 (BP Location: Right Arm)   Pulse 74   Temp 98.5 F (36.9 C) (Oral)   Resp 16   SpO2 96%   Physical Exam Vitals and nursing note reviewed.  Constitutional:      Appearance: She is well-developed.  HENT:     Head: Normocephalic and  atraumatic.     Nose: Nose normal.  Eyes:     Conjunctiva/sclera: Conjunctivae normal.     Pupils: Pupils are equal, round, and reactive to light.  Cardiovascular:     Rate and Rhythm: Normal rate and regular rhythm.     Pulses: Normal pulses.     Heart sounds: Normal heart sounds.  Pulmonary:     Effort: Pulmonary effort is normal.     Breath sounds: Normal breath sounds.  Abdominal:     General: Abdomen is flat. Bowel sounds are normal. There is no distension.     Palpations: There is no mass.     Tenderness: There is no abdominal tenderness. There is no guarding or rebound.     Hernia: No hernia is present.  Musculoskeletal:     Cervical back: Normal range of motion and neck supple.  Skin:    General: Skin is warm and dry.     Capillary Refill: Capillary refill takes less than 2 seconds.  Neurological:     General: No focal deficit present.     Mental Status: She is alert and oriented to person, place, and time.  Psychiatric:     Comments: Thrashing and yelling      ED Results / Procedures / Treatments   Labs (all labs ordered are listed, but only abnormal results are displayed) Results for orders placed or performed during the hospital encounter of 09/16/19  CBC with Differential/Platelet  Result Value Ref Range   WBC 39.7 (H) 4.0 - 10.5 K/uL   RBC 4.87 3.87 - 5.11 MIL/uL   Hemoglobin 16.0 (H) 12.0 - 15.0 g/dL   HCT 49.6 (H) 36 - 46 %   MCV 101.8 (H) 80.0 - 100.0 fL   MCH 32.9 26.0 - 34.0 pg   MCHC 32.3 30.0 - 36.0 g/dL   RDW 13.2 11.5 - 15.5 %   Platelets 254 150 - 400 K/uL   nRBC 0.0 0.0 - 0.2 %   Neutrophils Relative % 84 %   Neutro Abs 32.8 (H) 1.7 - 7.7 K/uL   Lymphocytes Relative 8 %   Lymphs Abs 3.3 0.7 - 4.0 K/uL   Monocytes Relative 7 %   Monocytes Absolute 2.8 (H) 0 - 1 K/uL   Eosinophils Relative 0 %   Eosinophils Absolute 0.0 0 - 0 K/uL   Basophils Relative 0 %   Basophils Absolute 0.2 (H) 0 - 0 K/uL   Immature Granulocytes 1 %   Abs Immature  Granulocytes 0.54 (H) 0.00 - 0.07 K/uL  Urinalysis, Routine w reflex microscopic  Result Value Ref Range   Color, Urine YELLOW YELLOW   APPearance CLEAR CLEAR   Specific Gravity, Urine <1.005 (L) 1.005 - 1.030   pH 8.0 5.0 - 8.0   Glucose, UA NEGATIVE NEGATIVE mg/dL   Hgb urine dipstick NEGATIVE NEGATIVE   Bilirubin Urine NEGATIVE NEGATIVE   Ketones, ur 20 (A) NEGATIVE mg/dL  Protein, ur 30 (A) NEGATIVE mg/dL   Nitrite NEGATIVE NEGATIVE   Leukocytes,Ua NEGATIVE NEGATIVE   RBC / HPF 6-10 0 - 5 RBC/hpf   WBC, UA 0-5 0 - 5 WBC/hpf   Bacteria, UA RARE (A) NONE SEEN   Squamous Epithelial / LPF 6-10 0 - 5   Mucus PRESENT   I-stat chem 8, ED (not at [facility] or [facility])  Result Value Ref Range   Sodium 144 135 - 145 mmol/L   Potassium 4.7 3.5 - 5.1 mmol/L   Chloride 111 98 - 111 mmol/L   BUN 12 6 - 20 mg/dL   Creatinine, Ser 9.62 0.44 - 1.00 mg/dL   Glucose, Bld 99 70 - 99 mg/dL   Calcium, Ion 9.52 (L) 1.15 - 1.40 mmol/L   TCO2 20 (L) 22 - 32 mmol/L   Hemoglobin 16.0 (H) 12.0 - 15.0 g/dL   HCT 84.1 (H) 36 - 46 %  I-Stat Beta hCG blood, ED ([facility], [facility], [facility] only)  Result Value Ref Range   I-stat hCG, quantitative <5.0 <5 mIU/mL   Comment 3           CT ABDOMEN PELVIS W CONTRAST  Result Date: 09/16/2019 CLINICAL DATA:  Lower abdominal pain increased WBC count EXAM: CT ABDOMEN AND PELVIS WITH CONTRAST TECHNIQUE: Multidetector CT imaging of the abdomen and pelvis was performed using the standard protocol following bolus administration of intravenous contrast. CONTRAST:  OMNIPAQUE IOHEXOL 300 MG/ML  SOLN COMPARISON:  None. FINDINGS: Lower chest: The visualized heart size within normal limits. No pericardial fluid/thickening. No hiatal hernia. Rounded patchy airspace consolidation seen within the left posterior infrahilar region. Hepatobiliary: The liver is normal in density without focal abnormality.The main portal vein is patent. No evidence of calcified gallstones, gallbladder wall thickening or  biliary dilatation. Pancreas: Unremarkable. No pancreatic ductal dilatation or surrounding inflammatory changes. Spleen: Normal in size without focal abnormality. Adrenals/Urinary Tract: Both adrenal glands appear normal. The kidneys and collecting system appear normal without evidence of urinary tract calculus or hydronephrosis. Bladder is unremarkable. Stomach/Bowel: The stomach, small bowel, and colon are normal in appearance. No inflammatory changes, wall thickening, or obstructive findings. There is a moderate amount of colonic stool with air and stool seen within the rectum. The appendix is normal. Vascular/Lymphatic: There are no enlarged mesenteric, retroperitoneal, or pelvic lymph nodes. No significant vascular findings are present. Reproductive: The uterus is unremarkable. There is a probable right adnexal corpus luteum cyst. Other: No evidence of abdominal wall mass or hernia. Musculoskeletal: No acute or significant osseous findings. IMPRESSION: Rounded patchy airspace consolidation in the left posterior infrahilar region, likely a due to infectious etiology. Would recommend short interval follow-up in 3-4 weeks upon resolution of symptoms and treatment. Electronically Signed   By: Jonna Clark M.D.   On: 09/16/2019 03:07   DG Abdomen Acute W/Chest  Result Date: 09/16/2019 CLINICAL DATA:  Pain EXAM: DG ABDOMEN ACUTE W/ 1V CHEST COMPARISON:  None. FINDINGS: Moderate stool burden throughout the colon. The bowel gas pattern is normal. There is no evidence of free intraperitoneal air. No suspicious radio-opaque calculi or other significant radiographic abnormality is seen. Heart size and mediastinal contours are within normal limits. Both lungs are clear. IMPRESSION: Moderate stool burden.  No acute findings. Electronically Signed   By: Charlett Nose M.D.   On: 09/16/2019 01:28    EKG None  Radiology CT ABDOMEN PELVIS W CONTRAST  Result Date: 09/16/2019 CLINICAL DATA:  Lower abdominal pain  increased WBC count EXAM:  CT ABDOMEN AND PELVIS WITH CONTRAST TECHNIQUE: Multidetector CT imaging of the abdomen and pelvis was performed using the standard protocol following bolus administration of intravenous contrast. CONTRAST:  100mL OMNIPAQUE IOHEXOL 300 MG/ML  SOLN COMPARISON:  None. FINDINGS: Lower chest: The visualized heart size within normal limits. No pericardial fluid/thickening. No hiatal hernia. Rounded patchy airspace consolidation seen within the left posterior infrahilar region. Hepatobiliary: The liver is normal in density without focal abnormality.The main portal vein is patent. No evidence of calcified gallstones, gallbladder wall thickening or biliary dilatation. Pancreas: Unremarkable. No pancreatic ductal dilatation or surrounding inflammatory changes. Spleen: Normal in size without focal abnormality. Adrenals/Urinary Tract: Both adrenal glands appear normal. The kidneys and collecting system appear normal without evidence of urinary tract calculus or hydronephrosis. Bladder is unremarkable. Stomach/Bowel: The stomach, small bowel, and colon are normal in appearance. No inflammatory changes, wall thickening, or obstructive findings. There is a moderate amount of colonic stool with air and stool seen within the rectum. The appendix is normal. Vascular/Lymphatic: There are no enlarged mesenteric, retroperitoneal, or pelvic lymph nodes. No significant vascular findings are present. Reproductive: The uterus is unremarkable. There is a probable right adnexal corpus luteum cyst. Other: No evidence of abdominal wall mass or hernia. Musculoskeletal: No acute or significant osseous findings. IMPRESSION: Rounded patchy airspace consolidation in the left posterior infrahilar region, likely a due to infectious etiology. Would recommend short interval follow-up in 3-4 weeks upon resolution of symptoms and treatment. Electronically Signed   By: Jonna ClarkBindu  Avutu M.D.   On: 09/16/2019 03:07   DG Abdomen Acute  W/Chest  Result Date: 09/16/2019 CLINICAL DATA:  Pain EXAM: DG ABDOMEN ACUTE W/ 1V CHEST COMPARISON:  None. FINDINGS: Moderate stool burden throughout the colon. The bowel gas pattern is normal. There is no evidence of free intraperitoneal air. No suspicious radio-opaque calculi or other significant radiographic abnormality is seen. Heart size and mediastinal contours are within normal limits. Both lungs are clear. IMPRESSION: Moderate stool burden.  No acute findings. Electronically Signed   By: Charlett NoseKevin  Dover M.D.   On: 09/16/2019 01:28    Procedures Procedures (including critical care time)  Medications Ordered in ED Medications  sodium chloride (PF) 0.9 % injection (  Not Given 09/16/19 0306)  amoxicillin-clavulanate (AUGMENTIN) 875-125 MG per tablet 1 tablet (1 tablet Oral Refused 09/16/19 0439)  haloperidol lactate (HALDOL) injection 5 mg (5 mg Intramuscular Given 09/16/19 0040)  alum & mag hydroxide-simeth (MAALOX/MYLANTA) 200-200-20 MG/5ML suspension 30 mL (30 mLs Oral Given 09/16/19 0145)    And  lidocaine (XYLOCAINE) 2 % viscous mouth solution 15 mL (15 mLs Oral Given 09/16/19 0146)  iohexol (OMNIPAQUE) 300 MG/ML solution 100 mL (100 mLs Intravenous Contrast Given 09/16/19 0242)  haloperidol lactate (HALDOL) injection 5 mg (5 mg Intravenous Given 09/16/19 0220)  ketorolac (TORADOL) 30 MG/ML injection 30 mg (30 mg Intravenous Given 09/16/19 0436)   Vomited in the ED and feels much improved.    ED Course  I have reviewed the triage vital signs and the nursing notes.  Pertinent labs & imaging results that were available during my care of the patient were reviewed by me and considered in my medical decision making (see chart for details).    Treated for PNA in the ED and RX given for antibiotics.  Given this patient has elevated white counts since 2016, it is worsening over time but not worked up I have referred the patient to hematology.  The patient is NOT septic.  She has no acute  findings  in the abdomen on the CT scan.  No UTI.  Have advised miralax for constipation and take all antibiotics as directed.  She is informed she must call for follow up with hematology to further test the source of ongoing worsening leukocytosis.  I have sent an Epic inbox message to oncology to attempt to arrange outpatient follow up for this patient.    Alaycia Eardley was evaluated in Emergency Department on 09/16/2019 for the symptoms described in the history of present illness. She was evaluated in the context of the global COVID-19 pandemic, which necessitated consideration that the patient might be at risk for infection with the SARS-CoV-2 virus that causes COVID-19. Institutional protocols and algorithms that pertain to the evaluation of patients at risk for COVID-19 are in a state of rapid change based on information released by regulatory bodies including the CDC and federal and state organizations. These policies and algorithms were followed during the patient's care in the ED.    Final Clinical Impression(s) / ED Diagnoses Return for intractable cough, coughing up blood,fevers >100.4 unrelieved by medication, shortness of breath, intractable vomiting, chest pain, shortness of breath, weakness,numbness, changes in speech, facial asymmetry,abdominal pain, passing out,Inability to tolerate liquids or food, cough, altered mental status or any concerns. No signs of systemic illness or infection. The patient is nontoxic-appearing on exam and vital signs are within normal limits.   I have reviewed the triage vital signs and the nursing notes. Pertinent labs &imaging results that were available during my care of the patient were reviewed by me and considered in my medical decision making (see chart for details).After history, exam, and medical workup I feel the patient has beenappropriately medically screened and is safe for discharge home. Pertinent diagnoses were discussed with the patient.  Patient was given return precautions.      Osvaldo Lamping, MD 09/16/19 2297    Cy Blamer, MD 09/16/19 [id number]

## 2019-09-20 ENCOUNTER — Telehealth: Payer: Self-pay | Admitting: Hematology and Oncology

## 2019-09-20 NOTE — Telephone Encounter (Signed)
Received a staff msg from Dr. Leonides Schanz to schedule Yvonne Waller for an appt w/him on 6/24 at 1130am w/labs following the appt. I cld and provided the appt date and time on the pt's voicemail.

## 2019-09-23 ENCOUNTER — Inpatient Hospital Stay: Payer: Self-pay

## 2019-09-23 ENCOUNTER — Inpatient Hospital Stay: Payer: Self-pay | Attending: Hematology and Oncology | Admitting: Hematology and Oncology

## 2019-10-09 ENCOUNTER — Emergency Department (HOSPITAL_COMMUNITY)
Admission: EM | Admit: 2019-10-09 | Discharge: 2019-10-09 | Disposition: A | Discharge status: Home / Self Care | Payer: Self-pay | Attending: Emergency Medicine | Admitting: Emergency Medicine

## 2019-10-09 ENCOUNTER — Emergency Department (HOSPITAL_COMMUNITY): Payer: Self-pay

## 2019-10-09 ENCOUNTER — Other Ambulatory Visit: Payer: Self-pay

## 2019-10-09 ENCOUNTER — Encounter (HOSPITAL_COMMUNITY): Payer: Self-pay

## 2019-10-09 DIAGNOSIS — F17200 Nicotine dependence, unspecified, uncomplicated: Secondary | ICD-10-CM | POA: Insufficient documentation

## 2019-10-09 DIAGNOSIS — R569 Unspecified convulsions: Secondary | ICD-10-CM | POA: Insufficient documentation

## 2019-10-09 DIAGNOSIS — K529 Noninfective gastroenteritis and colitis, unspecified: Secondary | ICD-10-CM | POA: Insufficient documentation

## 2019-10-09 LAB — URINALYSIS, ROUTINE W REFLEX MICROSCOPIC
Bilirubin Urine: NEGATIVE
Glucose, UA: NEGATIVE mg/dL
Ketones, ur: 40 mg/dL — AB
Leukocytes,Ua: NEGATIVE
Nitrite: NEGATIVE
Specific Gravity, Urine: <1.005 — ABNORMAL LOW (ref 1.005–1.030)
pH: 8 (ref 5.0–8.0)

## 2019-10-09 LAB — CBG MONITORING, ED: Glucose-Capillary: 141 mg/dL — ABNORMAL HIGH (ref 70–99)

## 2019-10-09 LAB — I-STAT BETA HCG BLOOD, ED (MC, WL, AP ONLY): I-stat hCG, quantitative: <5 m[IU]/mL (ref <5)

## 2019-10-09 LAB — CBC WITH DIFFERENTIAL/PLATELET
Abs Immature Granulocytes: 0.17 10*3/uL — ABNORMAL HIGH (ref 0.00–0.07)
Basophils Absolute: 0.1 10*3/uL (ref 0.0–0.1)
Basophils Relative: 0 %
Eosinophils Absolute: 0 10*3/uL (ref 0.0–0.5)
Eosinophils Relative: 0 %
HCT: 44.5 % (ref 36.0–46.0)
Hemoglobin: 15 g/dL (ref 12.0–15.0)
Immature Granulocytes: 1 %
Lymphocytes Relative: 10 %
Lymphs Abs: 2.7 10*3/uL (ref 0.7–4.0)
MCH: 32.9 pg (ref 26.0–34.0)
MCHC: 33.7 g/dL (ref 30.0–36.0)
MCV: 97.6 fL (ref 80.0–100.0)
Monocytes Absolute: 2.3 10*3/uL — ABNORMAL HIGH (ref 0.1–1.0)
Monocytes Relative: 8 %
Neutro Abs: 23.1 10*3/uL — ABNORMAL HIGH (ref 1.7–7.7)
Neutrophils Relative %: 81 %
Platelets: 267 10*3/uL (ref 150–400)
RBC: 4.56 MIL/uL (ref 3.87–5.11)
RDW: 12.7 % (ref 11.5–15.5)
WBC: 28.4 10*3/uL — ABNORMAL HIGH (ref 4.0–10.5)
nRBC: 0 % (ref 0.0–0.2)

## 2019-10-09 LAB — COMPREHENSIVE METABOLIC PANEL
ALT: 19 U/L (ref 0–44)
AST: 30 U/L (ref 15–41)
Albumin: 5 g/dL (ref 3.5–5.0)
Alkaline Phosphatase: 51 U/L (ref 38–126)
Anion gap: 15 (ref 5–15)
BUN: 19 mg/dL (ref 6–20)
CO2: 23 mmol/L (ref 22–32)
Calcium: 9.6 mg/dL (ref 8.9–10.3)
Chloride: 100 mmol/L (ref 98–111)
Creatinine, Ser: 0.72 mg/dL (ref 0.44–1.00)
GFR calc Af Amer: >60 mL/min (ref >60)
GFR calc non Af Amer: >60 mL/min (ref >60)
Glucose, Bld: 124 mg/dL — ABNORMAL HIGH (ref 70–99)
Potassium: 3.3 mmol/L — ABNORMAL LOW (ref 3.5–5.1)
Sodium: 138 mmol/L (ref 135–145)
Total Bilirubin: 0.8 mg/dL (ref 0.3–1.2)
Total Protein: 7.8 g/dL (ref 6.5–8.1)

## 2019-10-09 LAB — LIPASE, BLOOD: Lipase: 16 U/L (ref 11–51)

## 2019-10-09 LAB — URINALYSIS, MICROSCOPIC (REFLEX): WBC, UA: NONE SEEN WBC/hpf (ref 0–5)

## 2019-10-09 LAB — LACTIC ACID, PLASMA: Lactic Acid, Venous: 1.2 mmol/L (ref 0.5–1.9)

## 2019-10-09 LAB — ETHANOL: Alcohol, Ethyl (B): <10 mg/dL (ref <10)

## 2019-10-09 MED ORDER — LORAZEPAM 2 MG/ML IJ SOLN
1.0000 mg | Freq: Once | INTRAMUSCULAR | Status: AC
  Administered 2019-10-09: 1 mg via INTRAVENOUS

## 2019-10-09 MED ORDER — ONDANSETRON 4 MG PO TBDP
4.0000 mg | ORAL_TABLET | Freq: Three times a day (TID) | ORAL | 0 refills | Status: DC | PRN
Start: 2019-10-09 — End: 2019-11-19

## 2019-10-09 MED ORDER — LORAZEPAM 2 MG/ML IJ SOLN
INTRAMUSCULAR | Status: AC
  Filled 2019-10-09: qty 1

## 2019-10-09 MED ORDER — POTASSIUM CHLORIDE CRYS ER 20 MEQ PO TBCR
40.0000 meq | EXTENDED_RELEASE_TABLET | Freq: Once | ORAL | Status: AC
  Administered 2019-10-09: 40 meq via ORAL
  Filled 2019-10-09: qty 2

## 2019-10-09 MED ORDER — ONDANSETRON HCL 4 MG/2ML IJ SOLN
4.0000 mg | Freq: Once | INTRAMUSCULAR | Status: AC
  Administered 2019-10-09: 4 mg via INTRAVENOUS
  Filled 2019-10-09: qty 2

## 2019-10-09 MED ORDER — SODIUM CHLORIDE 0.9 % IV BOLUS
1000.0000 mL | Freq: Once | INTRAVENOUS | Status: AC
  Administered 2019-10-09: 1000 mL via INTRAVENOUS

## 2019-10-09 MED ORDER — IOHEXOL 300 MG/ML  SOLN
100.0000 mL | Freq: Once | INTRAMUSCULAR | Status: AC | PRN
  Administered 2019-10-09: 100 mL via INTRAVENOUS

## 2019-10-09 MED ORDER — FENTANYL CITRATE (PF) 100 MCG/2ML IJ SOLN
50.0000 ug | Freq: Once | INTRAMUSCULAR | Status: AC
  Administered 2019-10-09: 50 ug via INTRAVENOUS
  Filled 2019-10-09: qty 2

## 2019-10-09 NOTE — ED Provider Notes (Signed)
[facility]-EMERGENCY DEPT Provider Note   CSN: [id number] Arrival date & time: 10/09/19  0076     History No chief complaint on file.   Yvonne Waller is a 24 y.o. female with pertinent past medical history of bipolar 1, depression, seizures that presents the emergency department today via EMS for seizure-like activity.  EMS states that they had seen seizure-like activity that lasted about 30 seconds which consisted of intermittent shaking and thrashing and then she became nonverbal with more intermittent shaking and thrasing.  When patient arrived patient was in similar state, was able to press on her abdomen where she started localizing and speaking to me.  Patient continues to have intermittent shaking, does not appear to be seizure-like.  After Ativan given, patient is able to speak.  Patient states that she started having abdominal pain 3 days ago after she ate some fast food.  States that abdominal pain is generalized, but more in epigastric and umbilical area.  States that she started having vomiting after this.  Denies any bloody or coffee-ground emesis.  Patient states that she was vomiting this morning, and then started having a seizure.  Did not fall.  Denies LOC.  Patient states that she has not taken her seizure medications for multiple years due to not being able to afford it.  Patient is supposed to be on Keppra.  Patient does not see neurologist.    States abdominal pain is severe, does not radiate anywhere, she states that she is nauseous.  States that she was able to eat yesterday.  Last bowel movement yesterday.  Denies any diarrhea, melena, hematochezia.  Patient states that she was in normal health before this.  Patient states that she has Building [employer] and therefore cannot smoke marijuana.  States she has not smoked marijuana in 3 weeks, no alcohol use.  No IV drug use. No vaginal symptoms, vaginal bleeding, pelvic discharge.  HPI     Past Medical  History:  Diagnosis Date  . Bipolar 1 disorder (HCC)   . Depression   . Herpes genitalis   . Hydrocephalus (HCC)   . Leukocytosis   . Seizures (HCC)     There are no problems to display for this patient.   Past Surgical History:  Procedure Laterality Date  . TONSILLECTOMY       OB History   No obstetric history on file.     No family history on file.  Social History   Tobacco Use  . Smoking status: Current Every Day Smoker  . Smokeless tobacco: Current User  Substance Use Topics  . Alcohol use: No  . Drug use: No    Home Medications Prior to Admission medications   Medication Sig Start Date End Date Taking? Authorizing Provider  doxycycline (VIBRAMYCIN) 100 MG capsule Take 1 capsule (100 mg total) by mouth 2 (two) times daily for 14 days. 10/10/19 10/24/19  Mannie Stabile, PA-C  Etonogestrel (IMPLANON [place]) Inject 1 Units into the skin See admin instructions. Every 3 years Patient not taking: Reported on 10/10/2019    [provider]  famotidine (PEPCID) 20 MG tablet Take 1 tablet (20 mg total) by mouth 2 (two) times daily. Patient not taking: Reported on 10/10/2019 09/16/19   Palumbo, April, MD  ibuprofen (ADVIL) 200 MG tablet Take 200 mg by mouth every 6 (six) hours as needed for moderate pain.    [provider]  ibuprofen (ADVIL,MOTRIN) 800 MG tablet Take 1 tablet (800 mg total) by  mouth 3 (three) times daily. Patient not taking: Reported on 10/10/2019 11/14/16   Phillis HaggisMabe, Martha L, MD  levETIRAcetam (KEPPRA) 500 MG tablet Take 1 tablet (500 mg total) by mouth 2 (two) times daily. Patient not taking: Reported on 10/10/2019 11/14/16   Phillis HaggisMabe, Martha L, MD  metroNIDAZOLE (FLAGYL) 500 MG tablet Take 1 tablet (500 mg total) by mouth 2 (two) times daily for 14 days. 10/10/19 10/24/19  Mannie StabileAberman, Caroline C, PA-C  ondansetron (ZOFRAN ODT) 4 MG disintegrating tablet Take 1 tablet (4 mg total) by mouth every 8 (eight) hours as needed for nausea or vomiting. Patient not  taking: Reported on 10/10/2019 10/09/19   Farrel GordonPatel, Aman Bonet, PA-C  ondansetron (ZOFRAN ODT) 4 MG disintegrating tablet Take 1 tablet (4 mg total) by mouth every 8 (eight) hours as needed for nausea or vomiting. 10/10/19   Mannie StabileAberman, Caroline C, PA-C  penicillin v potassium (VEETID) 500 MG tablet Take 1 tablet (500 mg total) by mouth 3 (three) times daily. Patient not taking: Reported on 11/12/2016 07/14/16   Fayrene Helperran, [place], PA-C    Allergies    Patient has no known allergies.  Review of Systems   Review of Systems  Constitutional: Negative for chills, diaphoresis, fatigue and fever.  HENT: Negative for congestion, sore throat and trouble swallowing.   Eyes: Negative for pain and visual disturbance.  Respiratory: Negative for cough, shortness of breath and wheezing.   Cardiovascular: Negative for chest pain, palpitations and leg swelling.  Gastrointestinal: Positive for abdominal pain. Negative for abdominal distention, diarrhea, nausea and vomiting.  Genitourinary: Negative for difficulty urinating.  Musculoskeletal: Negative for back pain, neck pain and neck stiffness.  Skin: Negative for pallor.  Neurological: Negative for dizziness, tremors, seizures, syncope, facial asymmetry, speech difficulty, weakness, light-headedness, numbness and headaches.  Psychiatric/Behavioral: Positive for agitation. Negative for confusion.    Physical Exam Updated Vital Signs BP 113/78   Pulse 75   Temp 98.9 F (37.2 C) (Oral)   Resp 12   SpO2 96%   Physical Exam Constitutional:      General: She is not in acute distress.    Appearance: Normal appearance. She is not ill-appearing, toxic-appearing or diaphoretic.  HENT:     Mouth/Throat:     Mouth: Mucous membranes are moist.     Pharynx: Oropharynx is clear.  Eyes:     General: No scleral icterus.    Extraocular Movements: Extraocular movements intact.     Pupils: Pupils are equal, round, and reactive to light.  Cardiovascular:     Rate and Rhythm:  Normal rate and regular rhythm.     Pulses: Normal pulses.     Heart sounds: Normal heart sounds.  Pulmonary:     Effort: Pulmonary effort is normal. No respiratory distress.     Breath sounds: Normal breath sounds. No stridor. No wheezing, rhonchi or rales.  Chest:     Chest wall: No tenderness.  Abdominal:     General: Abdomen is flat. There is no distension.     Palpations: Abdomen is soft.     Tenderness: There is abdominal tenderness. There is guarding. There is no right CVA tenderness, left CVA tenderness or rebound.     Comments: Patient is tender to epigastrium with guarding.   Musculoskeletal:        General: No swelling or tenderness. Normal range of motion.     Cervical back: Normal range of motion and neck supple. No rigidity.     Right lower leg: No edema.  Left lower leg: No edema.  Skin:    General: Skin is warm and dry.     Capillary Refill: Capillary refill takes less than 2 seconds.     Coloration: Skin is not pale.  Neurological:     General: No focal deficit present.     Mental Status: She is alert and oriented to person, place, and time.     Comments: After pt had calmed down. Alert. Clear speech. No facial droop. CNIII-XII grossly intact. Bilateral upper and lower extremities' sensation grossly intact. 5/5 symmetric strength with grip strength and with plantar and dorsi flexion bilaterally. Negative pronator drift. Negative Romberg sign. Gait is steady and intact.   Psychiatric:     Comments: Patient crying and thrashing in room     ED Results / Procedures / Treatments   Labs (all labs ordered are listed, but only abnormal results are displayed) Labs Reviewed  CBC WITH DIFFERENTIAL/PLATELET - Abnormal; Notable for the following components:      Result Value   WBC 28.4 (*)    Neutro Abs 23.1 (*)    Monocytes Absolute 2.3 (*)    Abs Immature Granulocytes 0.17 (*)    All other components within normal limits  COMPREHENSIVE METABOLIC PANEL - Abnormal;  Notable for the following components:   Potassium 3.3 (*)    Glucose, Bld 124 (*)    All other components within normal limits  URINALYSIS, ROUTINE W REFLEX MICROSCOPIC - Abnormal; Notable for the following components:   Specific Gravity, Urine <1.005 (*)    Hgb urine dipstick SMALL (*)    Ketones, ur 40 (*)    Protein, ur TRACE (*)    All other components within normal limits  URINALYSIS, MICROSCOPIC (REFLEX) - Abnormal; Notable for the following components:   Bacteria, UA FEW (*)    All other components within normal limits  CBG MONITORING, ED - Abnormal; Notable for the following components:   Glucose-Capillary 141 (*)    All other components within normal limits  LIPASE, BLOOD  ETHANOL  LACTIC ACID, PLASMA  I-STAT BETA HCG BLOOD, ED ([facility], [facility], [facility] ONLY)    EKG EKG Interpretation  Date/Time:  Saturday October 09 2019 09:13:15 EDT Ventricular Rate:  66 PR Interval:    QRS Duration: 89 QT Interval:  396 QTC Calculation: 415 R Axis:   74 Text Interpretation: Sinus rhythm RSR' in V1 or V2, right VCD or RVH No significant change since last tracing Confirmed by Susy Frizzle [phone]) on 10/10/2019 9:38:11 AM   Radiology   Procedures Procedures (including critical care time)  Medications Ordered in ED Medications  sodium chloride 0.9 % bolus 1,000 mL (0 mLs Intravenous Stopped 10/09/19 1157)  LORazepam (ATIVAN) injection 1 mg (1 mg Intravenous Given 10/09/19 0905)  ondansetron (ZOFRAN) injection 4 mg (4 mg Intravenous Given 10/09/19 1157)  fentaNYL (SUBLIMAZE) injection 50 mcg (50 mcg Intravenous Given 10/09/19 1157)  iohexol (OMNIPAQUE) 300 MG/ML solution 100 mL (100 mLs Intravenous Contrast Given 10/09/19 1339)  fentaNYL (SUBLIMAZE) injection 50 mcg (50 mcg Intravenous Given 10/09/19 1454)  sodium chloride 0.9 % bolus 1,000 mL (0 mLs Intravenous Stopped 10/09/19 1805)  potassium chloride SA (KLOR-CON) CR tablet 40 mEq (40 mEq Oral Given 10/09/19 1709)    ED Course  I have  reviewed the triage vital signs and the nursing notes.  Pertinent labs & imaging results that were available during my care of the patient were reviewed by me and considered in my medical decision making (see chart  for details).  Clinical Course as of Oct 09 1737  Sat Oct 09, 2019  6644 Brought in by EMS for a seizure.  Patient is nonverbal and intermittently shaking.  Patient is able to localize pain and start screaming and crying when I push on her stomach.  Do not think that patient is currently seizing   [SP]  1014 1 mg of Ativan given for constant thrashing and yelling, after recheck of an hour patient is still asleep.   [SP]    Clinical Course User Index [SP] Farrel Gordon, PA-C   MDM Rules/Calculators/A&P                         Tanisa Lagace is a 24 y.o. female with pertinent past medical history of bipolar 1, depression, seizures that presents the emergency department today via EMS for seizure-like activity.  Patient with continuous shaking and stated that she thinks she is having seizure.  I think she continues to have pseudoseizures, patient is able to localize pain and speak during these episodes.  Labs not suggestive of seizure. After pain controlled, patient alert and oriented x4.  Patient is able to tell me everything that happened.  Normal neuro exam. No need for Head CT at this time, did disuss this with Dr. Madilyn Hook.   CT abdomen pelvis with no acute abnormalities, did discuss doing a pelvic exam since patient did have cyst on CT, patient denies this at this time.  Did discuss risks of this.  Patient states that she is not having any vaginal discharge, risky sexual behaviors.  CBC and CMP without any acute abnormalities.  CBC with leukocytosis of 28, this is patient's baseline.  Potassium 3.3, will replete in the ER. UA with signs of dehydration, 2 L of fluid given at this time.  Patient passed p.o. challenge.  Nursing states that they were able to see her run to the bathroom.  Pt  to be discharged, pain has been controlled here with Fentanyl. Will consult TOC to have patient have PCP due to leukocytosis and need to be on seizure meds, says she cant afford neurologist. Consult to transition care, they were unable to get patient's medications since patient was not match candidate.  Did discuss that patient needs to see primary care, transition of care is able to help with this.  Discussed with patient that this is extremely important since patient needs primary care for seizures and for elevated white count.  Did discuss this in depth.  Also discussed that patient needs to have her potassium rechecked.  Patient agreeable at this time.  Repeat abdominal exam without any tenderness.  Repeat neuro exam normal.  Patient to be discharged at this time. Return precuations and seizure precuations given.Also discussed these in depth with pt. Pt continues to deny referral to neurologist. Did discuss that If patient has any more seizure like activity she needs to see PCP to refer her to neurologist, pt agreeable with this. No seizure like activity seen here today.   Doubt need for further emergent work up at this time. I explained the diagnosis and have given explicit precautions to return to the ER including for any other new or worsening symptoms. The patient understands and accepts the medical plan as it's been dictated and I have answered their questions. Discharge instructions concerning home care and prescriptions have been given. The patient is STABLE and is discharged to home in good condition.  I discussed this case with  my attending physician who cosigned this note including patient's presenting symptoms, physical exam, and planned diagnostics and interventions. Attending physician stated agreement with plan or made changes to plan which were implemented.   Attending physician assessed patient at bedside. Final Clinical Impression(s) / ED Diagnoses Final diagnoses:  Gastroenteritis    Pseudoseizures    Rx / DC Orders ED Discharge Orders         Ordered    ondansetron (ZOFRAN ODT) 4 MG disintegrating tablet  Every 8 hours PRN     Discontinue  Reprint     10/09/19 1734           Farrel Gordon, PA-C 10/10/19 1749    Tilden Fossa, MD 10/11/19 1252

## 2019-10-09 NOTE — ED Triage Notes (Signed)
Patient coming from home with c/o abdomina pain about 2 days ago. She developed seizure activity to the hospital in the ambulance. Patient has history of seizure.

## 2019-10-09 NOTE — ED Notes (Signed)
Seizure pads applied to both side rails of bed.

## 2019-10-09 NOTE — TOC Initial Note (Signed)
Transition of Care [facility]) - Initial/Assessment Note    Patient Details  Name: Yvonne Waller MRN: [id number] Date of Birth: 02-Jan-1996  [facility]) CM/SW Contact:    Lockie Pares, RN Phone Number: 10/09/2019, 5:37 PM  Clinical Narrative:                  Has already had a MATCH, not eligible for [facility] program for medication assistance. Referred to community care for follow up. In discharge instructions Patient must all the number to participate in her own care.        Patient Goals and CMS Choice        Expected Discharge Plan and Services                                                Prior Living Arrangements/Services                       Activities of Daily Living      Permission Sought/Granted                  Emotional Assessment              Admission diagnosis:  abdominal pain There are no problems to display for this patient.  PCP:  Patient, No Pcp Per Pharmacy:   [id number] - [place], [place] - [address] AT [facility] [address] [place] [address] Phone: [phone] Fax: [phone]     Social Determinants of Health (SDOH) Interventions    Readmission Risk Interventions No flowsheet data found.

## 2019-10-09 NOTE — Discharge Instructions (Addendum)
You are seen today for abdominal pain, this is most likely due to food poisoning.  I really want you to see a primary care doctor, this is important as we discussed because your white blood count is always high when you come to the emergency department.  I think that your stomach pain could be from food poisoning.  I want you to use the Zofran as prescribed whenever you start feeling nauseous.  You can take over-the-counter Tylenol as prescribed on the bottle for pain.  Please come back to the emergency department if you have any new or worsening concerning symptoms.  Stay hydrated.  Use the attached instructions.

## 2019-10-09 NOTE — ED Notes (Signed)
Patient drinking apple juice at this time without difficulty. Apple juice given per PO challenge order.

## 2019-10-09 NOTE — ED Notes (Addendum)
Educated patient urine sample needed. Patient stated she could not pee. educated on in and out cath. Patient then stated she had to pee. Writer unhooked monitors from patient and assisted patient out of bed. Once in hallway, Patient grabbed back of her gown to hold it closed and began physically running independently to bathroom.  provided urine sample at this time. Urine sent to lab.

## 2019-10-10 ENCOUNTER — Emergency Department (HOSPITAL_COMMUNITY): Payer: PRIVATE HEALTH INSURANCE

## 2019-10-10 ENCOUNTER — Other Ambulatory Visit: Payer: Self-pay

## 2019-10-10 ENCOUNTER — Emergency Department (HOSPITAL_COMMUNITY)
Admission: EM | Admit: 2019-10-10 | Discharge: 2019-10-10 | Disposition: A | Discharge status: Home / Self Care | Payer: PRIVATE HEALTH INSURANCE | Attending: Emergency Medicine | Admitting: Emergency Medicine

## 2019-10-10 DIAGNOSIS — N739 Female pelvic inflammatory disease, unspecified: Secondary | ICD-10-CM

## 2019-10-10 DIAGNOSIS — F17200 Nicotine dependence, unspecified, uncomplicated: Secondary | ICD-10-CM | POA: Insufficient documentation

## 2019-10-10 DIAGNOSIS — N736 Female pelvic peritoneal adhesions (postinfective): Secondary | ICD-10-CM | POA: Insufficient documentation

## 2019-10-10 DIAGNOSIS — R112 Nausea with vomiting, unspecified: Secondary | ICD-10-CM | POA: Insufficient documentation

## 2019-10-10 LAB — URINALYSIS, ROUTINE W REFLEX MICROSCOPIC
Bilirubin Urine: NEGATIVE
Glucose, UA: NEGATIVE mg/dL
Hgb urine dipstick: NEGATIVE
Ketones, ur: 80 mg/dL — AB
Leukocytes,Ua: NEGATIVE
Nitrite: NEGATIVE
Protein, ur: 30 mg/dL — AB
Specific Gravity, Urine: 1.028 (ref 1.005–1.030)
pH: 6 (ref 5.0–8.0)

## 2019-10-10 LAB — CBC WITH DIFFERENTIAL/PLATELET
Abs Immature Granulocytes: 0.07 10*3/uL (ref 0.00–0.07)
Basophils Absolute: 0.1 10*3/uL (ref 0.0–0.1)
Basophils Relative: 0 %
Eosinophils Absolute: 0.1 10*3/uL (ref 0.0–0.5)
Eosinophils Relative: 1 %
HCT: 43.1 % (ref 36.0–46.0)
Hemoglobin: 14.5 g/dL (ref 12.0–15.0)
Immature Granulocytes: 0 %
Lymphocytes Relative: 16 %
Lymphs Abs: 3 10*3/uL (ref 0.7–4.0)
MCH: 33 pg (ref 26.0–34.0)
MCHC: 33.6 g/dL (ref 30.0–36.0)
MCV: 98.2 fL (ref 80.0–100.0)
Monocytes Absolute: 1.4 10*3/uL — ABNORMAL HIGH (ref 0.1–1.0)
Monocytes Relative: 7 %
Neutro Abs: 14.2 10*3/uL — ABNORMAL HIGH (ref 1.7–7.7)
Neutrophils Relative %: 76 %
Platelets: 215 10*3/uL (ref 150–400)
RBC: 4.39 MIL/uL (ref 3.87–5.11)
RDW: 12.6 % (ref 11.5–15.5)
WBC: 18.8 10*3/uL — ABNORMAL HIGH (ref 4.0–10.5)
nRBC: 0 % (ref 0.0–0.2)

## 2019-10-10 LAB — COMPREHENSIVE METABOLIC PANEL
ALT: 19 U/L (ref 0–44)
AST: 25 U/L (ref 15–41)
Albumin: 4.2 g/dL (ref 3.5–5.0)
Alkaline Phosphatase: 41 U/L (ref 38–126)
Anion gap: 11 (ref 5–15)
BUN: 12 mg/dL (ref 6–20)
CO2: 23 mmol/L (ref 22–32)
Calcium: 9.2 mg/dL (ref 8.9–10.3)
Chloride: 105 mmol/L (ref 98–111)
Creatinine, Ser: 0.57 mg/dL (ref 0.44–1.00)
GFR calc Af Amer: >60 mL/min (ref >60)
GFR calc non Af Amer: >60 mL/min (ref >60)
Glucose, Bld: 90 mg/dL (ref 70–99)
Potassium: 4.2 mmol/L (ref 3.5–5.1)
Sodium: 139 mmol/L (ref 135–145)
Total Bilirubin: 1 mg/dL (ref 0.3–1.2)
Total Protein: 6.9 g/dL (ref 6.5–8.1)

## 2019-10-10 LAB — WET PREP, GENITAL
Sperm: NONE SEEN
Yeast Wet Prep HPF POC: NONE SEEN

## 2019-10-10 LAB — LIPASE, BLOOD: Lipase: 16 U/L (ref 11–51)

## 2019-10-10 MED ORDER — CEFTRIAXONE SODIUM 1 G IJ SOLR
500.0000 mg | Freq: Once | INTRAMUSCULAR | Status: AC
  Administered 2019-10-10: 500 mg via INTRAMUSCULAR
  Filled 2019-10-10: qty 10

## 2019-10-10 MED ORDER — METRONIDAZOLE 500 MG PO TABS
500.0000 mg | ORAL_TABLET | Freq: Two times a day (BID) | ORAL | 0 refills | Status: AC
Start: 2019-10-10 — End: 2019-10-24

## 2019-10-10 MED ORDER — METRONIDAZOLE IN NACL 5-0.79 MG/ML-% IV SOLN
500.0000 mg | Freq: Once | INTRAVENOUS | Status: AC
  Administered 2019-10-10: 500 mg via INTRAVENOUS
  Filled 2019-10-10: qty 100

## 2019-10-10 MED ORDER — STERILE WATER FOR INJECTION IJ SOLN
INTRAMUSCULAR | Status: AC
  Administered 2019-10-10: 10 mL
  Filled 2019-10-10: qty 10

## 2019-10-10 MED ORDER — ONDANSETRON 4 MG PO TBDP
4.0000 mg | ORAL_TABLET | Freq: Three times a day (TID) | ORAL | 0 refills | Status: DC | PRN
Start: 2019-10-10 — End: 2019-11-19

## 2019-10-10 MED ORDER — FENTANYL CITRATE (PF) 100 MCG/2ML IJ SOLN
50.0000 ug | Freq: Once | INTRAMUSCULAR | Status: AC
  Administered 2019-10-10: 50 ug via INTRAVENOUS
  Filled 2019-10-10: qty 2

## 2019-10-10 MED ORDER — IOHEXOL 300 MG/ML  SOLN
100.0000 mL | Freq: Once | INTRAMUSCULAR | Status: AC | PRN
  Administered 2019-10-10: 100 mL via INTRAVENOUS

## 2019-10-10 MED ORDER — DOXYCYCLINE HYCLATE 100 MG PO CAPS
100.0000 mg | ORAL_CAPSULE | Freq: Two times a day (BID) | ORAL | 0 refills | Status: AC
Start: 2019-10-10 — End: 2019-10-24

## 2019-10-10 MED ORDER — DOXYCYCLINE HYCLATE 100 MG PO TABS
100.0000 mg | ORAL_TABLET | Freq: Once | ORAL | Status: AC
  Administered 2019-10-10: 100 mg via ORAL
  Filled 2019-10-10: qty 1

## 2019-10-10 MED ORDER — ONDANSETRON HCL 4 MG/2ML IJ SOLN
4.0000 mg | Freq: Once | INTRAMUSCULAR | Status: AC
  Administered 2019-10-10: 4 mg via INTRAVENOUS
  Filled 2019-10-10: qty 2

## 2019-10-10 MED ORDER — HYDROMORPHONE HCL 1 MG/ML IJ SOLN
1.0000 mg | Freq: Once | INTRAMUSCULAR | Status: AC
  Administered 2019-10-10: 1 mg via INTRAVENOUS
  Filled 2019-10-10: qty 1

## 2019-10-10 MED ORDER — SODIUM CHLORIDE 0.9 % IV BOLUS
1000.0000 mL | Freq: Once | INTRAVENOUS | Status: AC
  Administered 2019-10-10: 1000 mL via INTRAVENOUS

## 2019-10-10 NOTE — Discharge Instructions (Addendum)
As discussed, your CT scan and pelvic exam were significant for signs of pelvic inflammatory disease. I have included information on it. I am sending you home with antibiotics. Take as prescribed and finish all antibiotics. If your symptoms do not improve within the next week, please follow-up with OBGYN. I have included the number for OBGYN. Return to the ER for new or worsening symptoms.  ------------------------------------- At this time there does not appear to be the presence of an emergent medical condition, however there is always the potential for conditions to change. Please read and follow the below instructions.  Please return to the Emergency Department immediately for any new or worsening symptoms. Please be sure to follow up with your Primary Care Provider  and the OBGYN within one week regarding your visit today; please call their office to schedule an appointment even if you are feeling better for a follow-up visit.    Please read the additional information packets attached to your discharge summary.  Do not take your medicine if  develop an itchy rash, swelling in your mouth or lips, or difficulty breathing; call 911 and seek immediate emergency medical attention if this occurs.  You may review your lab tests and imaging results in their entirety on your MyChart account.  Please discuss all results of fully with your primary care provider and other specialist at your follow-up visit.  Note: Portions of this text may have been transcribed using voice recognition software. Every effort was made to ensure accuracy; however, inadvertent computerized transcription errors may still be present.

## 2019-10-10 NOTE — ED Provider Notes (Signed)
Care handoff received from Claudette Stapler, PA-C at shift change, please see previous provider note for full details.  In short 25 year old female arrives for abdominal pain.  She was here yesterday for the same concerns.  Symptoms worsening, yesterday she received a CT scan which did not show any evidence of emergent process.  Pelvic exam was deferred yesterday.  Pelvic examination today reveals PID.  Repeat CT scan was obtained for concern of possible development of appendicitis but was negative.  Pelvic ultrasounds were obtained for evaluation of TOA.  Plan of care at shift change is to follow-up on pelvic ultrasound studies, pending no TOA discharged with PID treatment.  Physical Exam  BP 118/82 (BP Location: Left Arm)   Pulse 82   Temp 98.2 F (36.8 C) (Oral)   Resp 18   Ht 5\' 4"  (1.626 m)   SpO2 100%   BMI 20.08 kg/m   Physical Exam Constitutional:      General: She is not in acute distress.    Appearance: Normal appearance. She is well-developed. She is not ill-appearing or diaphoretic.  HENT:     Head: Normocephalic and atraumatic.  Eyes:     General: Vision grossly intact. Gaze aligned appropriately.     Pupils: Pupils are equal, round, and reactive to light.  Neck:     Trachea: Trachea and phonation normal.  Pulmonary:     Effort: Pulmonary effort is normal. No respiratory distress.  Abdominal:     Palpations: Abdomen is soft.     Tenderness: There is no abdominal tenderness.  Musculoskeletal:        General: Normal range of motion.     Cervical back: Normal range of motion.  Skin:    General: Skin is warm and dry.  Neurological:     Mental Status: She is alert.     GCS: GCS eye subscore is 4. GCS verbal subscore is 5. GCS motor subscore is 6.     Comments: Speech is clear and goal oriented, follows commands Major Cranial nerves without deficit, no facial droop Moves extremities without ataxia, coordination intact  Psychiatric:        Behavior: Behavior normal.      ED Course/Procedures   Clinical Course as of Oct 09 1744  Sun Oct 10, 2019  1231 WBC(!): 18.8 [CA]  1450 Trich, Wet Prep(!): PRESENT [CA]  1529 Clue Cells Wet Prep HPF POC(!): PRESENT [CA]  1529 WBC, Wet Prep HPF POC(!): MANY [CA]    Clinical Course User Index [CA] Oct 12, 2019 C, PA-C    Procedures  MDM  CBC shows leukocytosis of 18.8, improved from yesterday. CMP within normal limits. Lipase within normal limits. Urinalysis suggestive of dehydration with ketones and protein. Wet prep shows trichomonas, clue cells, WBCs.  Pregnancy test yesterday 10/09/2019 was negative.  CT abdomen pelvis:  IMPRESSION:  1. Hazy interstitial changes in the pelvis surrounding the uterus  and ovaries and a small amount of free pelvic fluid. No discrete  abscess or TOA but findings could reflect changes of PID. Recommend  clinical correlation.  2. The appendix is not identified for certain but I do not see any  definite findings for acute appendicitis.  3. 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   12/10/2019 Pelvis with Dopper:  IMPRESSION:  1. No cause for pelvic pain identified. The endometrium, uterus, and  ovaries are unremarkable. The small amount of fluid in the pelvis is  likely physiologic.  - Patient was given 500 mg  IM Rocephin.  Started on doxycycline 100 mg and Flagyl 500 mg here for treatment of PID.  Patient tolerating p.o. without difficulty.  AVS and prescriptions completed by previous provider.  I reassessed the patient, she is sleeping comfortably in bed vital signs within normal limits.  She is easily arousable to voice.  She reports that she is feeling better, she states understanding of care plan and is agreeable for discharge.  I discussed return precautions with the patient.  She does not need a work note.  Patient aware to take doxycycline and Flagyl twice daily for the next 2 weeks.  She was also prescribed Zofran by previous team.  I  encourage patient to increase her water intake.  At this time there does not appear to be any evidence of an acute emergency medical condition and the patient appears stable for discharge with appropriate outpatient follow up. Diagnosis was discussed with patient who verbalizes understanding of care plan and is agreeable to discharge. I have discussed return precautions with patient who verbalizes understanding. Patient encouraged to follow-up with their PCP and OBGYN. All questions answered.   Note: Portions of this report may have been transcribed using voice recognition software. Every effort was made to ensure accuracy; however, inadvertent computerized transcription errors may still be present.   Elizabeth Palau 10/10/19 1807    Gerhard Munch, MD 10/12/19 0001

## 2019-10-10 NOTE — ED Triage Notes (Signed)
Patient BIB [facility] reports of abd pain , patient evaluated here yesterday.

## 2019-10-10 NOTE — ED Notes (Signed)
Pt returned from CT, pt in pain. Will make EDP aware for new orders.

## 2019-10-10 NOTE — ED Notes (Signed)
Ultrasound at bedside

## 2019-10-10 NOTE — ED Notes (Signed)
Patient transported to CT 

## 2019-10-10 NOTE — ED Provider Notes (Signed)
[facility]-EMERGENCY DEPT Provider Note   CSN: [id number] Arrival date & time: 10/10/19  1041     History Chief Complaint  Patient presents with  . Abdominal Pain    Yvonne DartingJasmine Grissom is a 24 y.o. female with a past medical history significant for bipolar 1 disorder, depression, hydrocephalus, and seizures who presents to the ED due to worsening abdominal pain that has been ongoing for the past 4 days.  States abdominal pain started after [place] roughly 4 days ago.  Patient was evaluated in the ED yesterday where a CT abdomen was performed which demonstrated a probable collapsing left ovarian cyst/follicle, but no other remarkable findings.  Patient had a significant leukocytosis at 28.4 yesterday however it appears patient has chronically elevated white blood cell count.  Patient deferred pelvic exam yesterday.  Patient states abdominal pain has worsened over the past 24 hours.  She notes most of her pain is surrounding her umbilicus.  She has had one episode of nonbloody, nonbilious emesis today.  She has tried ibuprofen with no relief.  Denies urinary vaginal symptoms.  No previous abdominal operations.  Denies fever and chills.  Denies melena, hematochezia, and hematemesis.  Denies marijuana use.  Denies alcohol use.  No IV drugs.  History obtained from patient and past medical records. No interpreter used during encounter.      Past Medical History:  Diagnosis Date  . Bipolar 1 disorder (HCC)   . Depression   . Herpes genitalis   . Hydrocephalus (HCC)   . Leukocytosis   . Seizures (HCC)     There are no problems to display for this patient.   Past Surgical History:  Procedure Laterality Date  . TONSILLECTOMY       OB History   No obstetric history on file.     No family history on file.  Social History   Tobacco Use  . Smoking status: Current Every Day Smoker  . Smokeless tobacco: Current User  Substance Use Topics  . Alcohol use:  No  . Drug use: No    Home Medications Prior to Admission medications   Medication Sig Start Date End Date Taking? Authorizing Provider  ibuprofen (ADVIL) 200 MG tablet Take 200 mg by mouth every 6 (six) hours as needed for moderate pain.   Yes [provider]  doxycycline (VIBRAMYCIN) 100 MG capsule Take 1 capsule (100 mg total) by mouth 2 (two) times daily for 14 days. 10/10/19 10/24/19  Mannie StabileAberman, Hallelujah Wysong C, PA-C  Etonogestrel (IMPLANON [place]) Inject 1 Units into the skin See admin instructions. Every 3 years Patient not taking: Reported on 10/10/2019    [provider]  famotidine (PEPCID) 20 MG tablet Take 1 tablet (20 mg total) by mouth 2 (two) times daily. Patient not taking: Reported on 10/10/2019 09/16/19   Palumbo, April, MD  ibuprofen (ADVIL,MOTRIN) 800 MG tablet Take 1 tablet (800 mg total) by mouth 3 (three) times daily. Patient not taking: Reported on 10/10/2019 11/14/16   Phillis HaggisMabe, Martha L, MD  levETIRAcetam (KEPPRA) 500 MG tablet Take 1 tablet (500 mg total) by mouth 2 (two) times daily. Patient not taking: Reported on 10/10/2019 11/14/16   Phillis HaggisMabe, Martha L, MD  metroNIDAZOLE (FLAGYL) 500 MG tablet Take 1 tablet (500 mg total) by mouth 2 (two) times daily for 14 days. 10/10/19 10/24/19  Mannie StabileAberman, Hser Belanger C, PA-C  ondansetron (ZOFRAN ODT) 4 MG disintegrating tablet Take 1 tablet (4 mg total) by mouth every 8 (eight) hours as needed  for nausea or vomiting. Patient not taking: Reported on 10/10/2019 10/09/19   Farrel Gordon, PA-C  ondansetron (ZOFRAN ODT) 4 MG disintegrating tablet Take 1 tablet (4 mg total) by mouth every 8 (eight) hours as needed for nausea or vomiting. 10/10/19   Mannie Stabile, PA-C  penicillin v potassium (VEETID) 500 MG tablet Take 1 tablet (500 mg total) by mouth 3 (three) times daily. Patient not taking: Reported on 11/12/2016 07/14/16   Fayrene Helper, PA-C    Allergies    Patient has no known allergies.  Review of Systems   Review of Systems    Constitutional: Negative for chills and fever.  Gastrointestinal: Positive for abdominal pain, nausea and vomiting. Negative for diarrhea.  Genitourinary: Negative for dysuria and vaginal discharge.  All other systems reviewed and are negative.   Physical Exam Updated Vital Signs BP (!) 119/92   Pulse (!) 57   Temp 98.2 F (36.8 C) (Oral)   Resp 20   Ht 5\' 4"  (1.626 m)   SpO2 96%   BMI 20.08 kg/m   Physical Exam Vitals and nursing note reviewed. Exam conducted with a chaperone present.  Constitutional:      General: She is not in acute distress.    Appearance: She is not toxic-appearing.     Comments: Patient appears to be very uncomfortable in bed  HENT:     Head: Normocephalic.  Eyes:     Pupils: Pupils are equal, round, and reactive to light.  Cardiovascular:     Rate and Rhythm: Normal rate and regular rhythm.     Pulses: Normal pulses.     Heart sounds: Normal heart sounds. No murmur heard.  No friction rub. No gallop.   Pulmonary:     Effort: Pulmonary effort is normal.     Breath sounds: Normal breath sounds.  Abdominal:     General: Abdomen is flat. Bowel sounds are normal. There is no distension.     Palpations: Abdomen is soft.     Tenderness: There is abdominal tenderness. There is guarding. There is no right CVA tenderness, left CVA tenderness or rebound.     Comments: Generalized abdominal tenderness with voluntary guarding.  Negative CVA tenderness bilaterally.  Genitourinary:    Comments: Moderate amount of blood in vaginal vault. Tenderness throughout. No noticeable discharge.  Musculoskeletal:     Cervical back: Neck supple.     Comments: Able to move all 4 extremities without difficulty.  Skin:    General: Skin is warm and dry.  Neurological:     General: No focal deficit present.     Mental Status: She is alert.     ED Results / Procedures / Treatments   Labs (all labs ordered are listed, but only abnormal results are displayed) Labs  Reviewed  WET PREP, GENITAL - Abnormal; Notable for the following components:      Result Value   Trich, Wet Prep PRESENT (*)    Clue Cells Wet Prep HPF POC PRESENT (*)    WBC, Wet Prep HPF POC MANY (*)    All other components within normal limits  CBC WITH DIFFERENTIAL/PLATELET - Abnormal; Notable for the following components:   WBC 18.8 (*)    Neutro Abs 14.2 (*)    Monocytes Absolute 1.4 (*)    All other components within normal limits  URINALYSIS, ROUTINE W REFLEX MICROSCOPIC - Abnormal; Notable for the following components:   APPearance HAZY (*)    Ketones, ur 80 (*)  Protein, ur 30 (*)    Bacteria, UA FEW (*)    All other components within normal limits  COMPREHENSIVE METABOLIC PANEL  LIPASE, BLOOD  GC/CHLAMYDIA PROBE AMP ([place]) NOT AT [facility]    EKG None  Radiology CT ABDOMEN PELVIS W CONTRAST  Result Date: 10/10/2019 CLINICAL DATA:  Right lower quadrant abdominal pain for 3 days. EXAM: CT ABDOMEN AND PELVIS WITH CONTRAST TECHNIQUE: Multidetector CT imaging of the abdomen and pelvis was performed using the standard protocol following bolus administration of intravenous contrast. CONTRAST:  OMNIPAQUE IOHEXOL 300 MG/ML  SOLN COMPARISON:  CT scan 09/16/2019 and 10/09/2019 FINDINGS: Lower chest: The lung bases are clear of acute process. No pleural effusion or pulmonary lesions. The heart is normal in size. No pericardial effusion. The distal esophagus and aorta are unremarkable. Hepatobiliary: No focal hepatic lesions or intrahepatic biliary dilatation. The gallbladder contains high attenuation material which is likely contrast from yesterday CT scan. No common bile duct dilatation. Pancreas: No mass, inflammation or ductal dilatation. Spleen: Normal size.  No focal lesions. Adrenals/Urinary Tract: The adrenal glands and kidneys are unremarkable. No renal, ureteral or bladder calculi or mass. No findings suspicious for pyelonephritis. Stomach/Bowel: The stomach, duodenum,  small bowel and colon are grossly normal without oral contrast. No acute inflammatory changes, mass lesions or obstructive findings. The terminal ileum is normal. The appendix is not identified for certain but I do not see any definite findings for acute appendicitis. Vascular/Lymphatic: The aorta is normal in caliber. No dissection. The branch vessels are patent. The major venous structures are patent. No mesenteric or retroperitoneal mass or adenopathy. Small scattered lymph nodes are noted. Reproductive: The uterus is unremarkable. Both ovaries are grossly normal. There is hazy interstitial changes in the pelvis surrounding the uterus and ovaries and a small amount of free pelvic fluid. No discrete abscess or TOA but findings could reflect changes of PID. Prominent parametrial vessels suggesting pelvic congestion syndrome. Other: No inguinal mass or hernia.  No subcutaneous lesions. Musculoskeletal: The bony structures are unremarkable. IMPRESSION: 1. Hazy interstitial changes in the pelvis surrounding the uterus and ovaries and a small amount of free pelvic fluid. No discrete abscess or TOA but findings could reflect changes of PID. Recommend clinical correlation. 2. The appendix is not identified for certain but I do not see any definite findings for acute appendicitis. 3. Prominent parametrial vessels suggesting pelvic congestion syndrome. Electronically Signed   By: Rudie Meyer M.D.   On: 10/10/2019 15:57   CT ABDOMEN PELVIS W CONTRAST  Result Date: 10/09/2019 CLINICAL DATA:  Abdominal pain with recent seizure EXAM: CT ABDOMEN AND PELVIS WITH CONTRAST TECHNIQUE: Multidetector CT imaging of the abdomen and pelvis was performed using the standard protocol following bolus administration of intravenous contrast. CONTRAST:  OMNIPAQUE IOHEXOL 300 MG/ML  SOLN COMPARISON:  September 16, 2019 FINDINGS: Lower chest: Lung bases are clear. Hepatobiliary: No focal liver lesions are appreciable. Gallbladder wall is  not appreciably thickened. There is no biliary duct dilatation. Pancreas: There is no pancreatic mass or inflammatory focus. Spleen: No splenic lesions are evident. Adrenals/Urinary Tract: Adrenals bilaterally appear normal. There is a 4 mm apparent cyst arising from the periphery of the upper pole the right kidney. There is no evident hydronephrosis on either side. There is no renal or ureteral calculus on either side. Urinary bladder is midline with wall thickness within normal limits. Stomach/Bowel: There is no appreciable bowel wall or mesenteric thickening. There is no evident bowel obstruction. The terminal  ileum appears normal. There is no appreciable free air or portal venous air. Vascular/Lymphatic: There is no abdominal aortic aneurysm. No arterial vascular lesions are evident. Major venous structures appear patent. There is no evident adenopathy in the abdomen or pelvis. Reproductive: The uterus is anteverted. There is a follicle in the left ovary measuring 1.1 x 0.8 cm. Slight peripheral enhancement in this area may indicate collapsing follicle. No other pelvic mass evident. Other: No appendiceal region inflammation evident. No evident abscess or ascites in the abdomen or pelvis. Musculoskeletal: There are no blastic or lytic bone lesions. No intramuscular or abdominal wall lesions are evident. IMPRESSION: 1.  Probable collapsing left ovarian cyst/follicle. 2. No bowel wall thickening or bowel obstruction. No abscess in the abdomen pelvis. Appendix region appears normal. 3. No evident renal or ureteral calculus. No hydronephrosis. Urinary bladder wall thickness normal. Electronically Signed   By: Bretta Bang III M.D.   On: 10/09/2019 14:19   DG Chest Portable 1 View  Result Date: 10/09/2019 CLINICAL DATA:  Chest pain EXAM: PORTABLE CHEST 1 VIEW COMPARISON:  September 15, 2016 FINDINGS: The heart size and mediastinal contours are within normal limits. Both lungs are clear. The visualized skeletal  structures are unremarkable. IMPRESSION: No active disease. Electronically Signed   By: Gerome Sam III M.D   On: 10/09/2019 12:18    Procedures Procedures (including critical care time)  Medications Ordered in ED Medications  metroNIDAZOLE (FLAGYL) IVPB 500 mg (500 mg Intravenous New Bag/Given (Non-Interop) 10/10/19 1721)  sodium chloride 0.9 % bolus 1,000 mL (0 mLs Intravenous Stopped 10/10/19 1400)  ondansetron (ZOFRAN) injection 4 mg (4 mg Intravenous Given 10/10/19 1245)  fentaNYL (SUBLIMAZE) injection 50 mcg (50 mcg Intravenous Given 10/10/19 1246)  iohexol (OMNIPAQUE) 300 MG/ML solution 100 mL (100 mLs Intravenous Contrast Given 10/10/19 1531)  HYDROmorphone (DILAUDID) injection 1 mg (1 mg Intravenous Given 10/10/19 1549)  cefTRIAXone (ROCEPHIN) injection 500 mg (500 mg Intramuscular Given 10/10/19 1720)  doxycycline (VIBRA-TABS) tablet 100 mg (100 mg Oral Given 10/10/19 1720)  sterile water (preservative free) injection (10 mLs  Given 10/10/19 1725)    ED Course  I have reviewed the triage vital signs and the nursing notes.  Pertinent labs & imaging results that were available during my care of the patient were reviewed by me and considered in my medical decision making (see chart for details).  Clinical Course as of Oct 10 1731  Sun Oct 10, 2019  1231 WBC(!): 18.8 [CA]  1450 Trich, Wet Prep(!): PRESENT [CA]  1529 Clue Cells Wet Prep HPF POC(!): PRESENT [CA]  1529 WBC, Wet Prep HPF POC(!): MANY [CA]    Clinical Course User Index [CA] Mannie Stabile, PA-C   MDM Rules/Calculators/A&P                         24 year old female presents to the ED due to worsening abdominal pain located around her umbilicus.  Patient was evaluated yesterday in the ED where a CT scan demonstrated possible collapsed ovarian cyst/follicle, but otherwise unremarkable.  Abdominal pain started roughly 4 days ago after eating at [place].  Upon arrival, vitals all within normal limits.  Patient is  afebrile, not tachycardic or hypoxic.  Patient nontoxic-appearing. Abdomen soft, nondistended with generalized abdominal tenderness with voluntary guarding.  Negative CVA tenderness bilaterally.  Will obtain routine labs to compare from yesterday.  We will hold off on CT abdomen given unremarkable findings yesterday. IV fluids, fentanyl, and Zofran given for  symptomatic relief.   CMP unremarkable with normal renal function and no major electrolyte derangements.  CBC significant for leukocytosis at 18.8 which is an improvement from yesterday.  Lipase normal at 16.  Doubt pancreatitis. Pregnancy test yesterday negative.   2:31 PM reassessed patient at bedside.  Continued abdominal pain.  Abdomen soft, nondistended with tenderness palpation most significant around the umbilicus. Given patient's continued abdominal pain, will repeat CT scan to rule out appendicitis. Will perform pelvic exam to rule out vaginal etiology. Discussed case with Dr. Juleen China who evaluated patient at bedside and agrees with assessment and plan.   Performed pelvic with chaperone in the room. Moderate amount of blood in vaginal vault. Pain was severely tender throughout exam. No visible discharge, but could be hidden due to blood. Wet prep and gonorrhea/chlamydia cultures obtained.   Wet prep positive for trichomonas, clue cells, many white blood cells. CT abdomen personally reviewed which demonstrates: IMPRESSION:  1. Hazy interstitial changes in the pelvis surrounding the uterus  and ovaries and a small amount of free pelvic fluid. No discrete  abscess or TOA but findings could reflect changes of PID. Recommend  clinical correlation.  2. The appendix is not identified for certain but I do not see any  definite findings for acute appendicitis.  3. 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   Given patient's extreme tenderness on exam, will treat for PID. Will obtain pelvic US due to nonspecific  findings on CT scan.   Patient handed off to [facility], PA-C at shift change pending [place] results. If no TOA and patient able to tolerate po, she may be discharged home with PID treatment.  Final Clinical Impression(s) / ED Diagnoses Final diagnoses:  Pelvic inflammatory disease    Rx / DC Orders ED Discharge Orders         Ordered    doxycycline (VIBRAMYCIN) 100 MG capsule  2 times daily     Discontinue  Reprint     10/10/19 1646    metroNIDAZOLE (FLAGYL) 500 MG tablet  2 times daily     Discontinue  Reprint     10/10/19 1646    ondansetron (ZOFRAN ODT) 4 MG disintegrating tablet  Every 8 hours PRN     Discontinue  Reprint     10/10/19 1651           Jesusita Oka 10/10/19 1733    Raeford Razor, MD 10/17/19 1130

## 2019-10-10 NOTE — ED Notes (Signed)
PO meds crushed and put in applesauce. Pt given ice water as well.

## 2019-10-11 LAB — GC/CHLAMYDIA PROBE AMP (~~LOC~~) NOT AT ARMC
Chlamydia: NEGATIVE
Comment: NEGATIVE
Comment: NORMAL
Neisseria Gonorrhea: NEGATIVE

## 2019-11-19 ENCOUNTER — Other Ambulatory Visit: Payer: Self-pay

## 2019-11-19 ENCOUNTER — Encounter (HOSPITAL_COMMUNITY): Payer: Self-pay

## 2019-11-19 ENCOUNTER — Ambulatory Visit (HOSPITAL_COMMUNITY)
Admission: EM | Admit: 2019-11-19 | Discharge: 2019-11-19 | Disposition: A | Discharge status: Home / Self Care | Payer: Medicaid Other

## 2019-11-19 DIAGNOSIS — J340 Abscess, furuncle and carbuncle of nose: Secondary | ICD-10-CM

## 2019-11-19 NOTE — ED Triage Notes (Signed)
Pt c/o "I stuck my finger in my nose and there's a hole through to the other side from doing cocaine." Denies bleeding.

## 2019-11-19 NOTE — Discharge Instructions (Addendum)
Please follow up with the specialist Stop doing cocaine.

## 2019-11-20 NOTE — ED Provider Notes (Signed)
[facility]    CSN: [id number] Arrival date & time: 11/19/19  2122      History   Chief Complaint Chief Complaint  Patient presents with  . Nose Problem    HPI Yvonne Waller is a 24 y.o. female.   Patient is a 24 year old female presents today with "hole in her nose".  Reporting that she stuck her finger and was able to stick through the nasal septum.  There has been no bleeding.  Noticed this today.  She has been doing cocaine for approximate 2 years and is concerned this is the cause.     Past Medical History:  Diagnosis Date  . Bipolar 1 disorder (HCC)   . Depression   . Herpes genitalis   . Hydrocephalus (HCC)   . Leukocytosis   . Seizures (HCC)     There are no problems to display for this patient.   Past Surgical History:  Procedure Laterality Date  . TONSILLECTOMY      OB History   No obstetric history on file.      Home Medications    Prior to Admission medications   Not on File    Family History Family History  Family history unknown: Yes    Social History Social History   Tobacco Use  . Smoking status: Current Every Day Smoker  . Smokeless tobacco: Current User  Substance Use Topics  . Alcohol use: No  . Drug use: No     Allergies   Patient has no known allergies.   Review of Systems Review of Systems   Physical Exam Triage Vital Signs ED Triage Vitals  Enc Vitals Group     BP 11/19/19 1136 98/70     Pulse Rate 11/19/19 1136 65     Resp 11/19/19 1136 16     Temp 11/19/19 1136 98 F (36.7 C)     Temp src --      SpO2 11/19/19 1136 98 %     Weight --      Height --      Head Circumference --      Peak Flow --      Pain Score 11/19/19 1137 0     Pain Loc --      Pain Edu? --      Excl. in GC? --    No data found.  Updated Vital Signs BP 98/70   Pulse 65   Temp 98 F (36.7 C)   Resp 16   LMP 11/03/2019   SpO2 98%   Visual Acuity Right Eye Distance:   Left Eye Distance:   Bilateral  Distance:    Right Eye Near:   Left Eye Near:    Bilateral Near:     Physical Exam Vitals and nursing note reviewed.  Constitutional:      General: She is not in acute distress.    Appearance: Normal appearance. She is not ill-appearing, toxic-appearing or diaphoretic.  HENT:     Head: Normocephalic.     Nose: Nasal deformity and mucosal edema present.     Comments: Ulcerated septum.  No active bleeding.  Eyes:     Conjunctiva/sclera: Conjunctivae normal.  Pulmonary:     Effort: Pulmonary effort is normal.  Musculoskeletal:        General: Normal range of motion.     Cervical back: Normal range of motion.  Skin:    General: Skin is warm and dry.     Findings: No rash.  Neurological:     Mental Status: She is alert.  Psychiatric:        Mood and Affect: Mood normal.      UC Treatments / Results  Labs (all labs ordered are listed, but only abnormal results are displayed) Labs Reviewed - No data to display  EKG   Radiology No results found.  Procedures Procedures (including critical care time)  Medications Ordered in UC Medications - No data to display  Initial Impression / Assessment and Plan / UC Course  I have reviewed the triage vital signs and the nursing notes.  Pertinent labs & imaging results that were available during my care of the patient were reviewed by me and considered in my medical decision making (see chart for details).     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  Recommended stop using cocaine. Follow-up with specialist as needed Final Clinical Impressions(s) / UC Diagnoses   Final diagnoses:  Nasal septum ulceration     Discharge Instructions     Please follow up with the specialist Stop doing cocaine.     ED Prescriptions    None     PDMP not reviewed this encounter.   Janace Aris, NP 11/20/19 1138

## 2020-06-18 ENCOUNTER — Encounter (HOSPITAL_COMMUNITY): Payer: Self-pay

## 2020-06-18 ENCOUNTER — Other Ambulatory Visit: Payer: Self-pay

## 2020-06-18 ENCOUNTER — Ambulatory Visit (HOSPITAL_COMMUNITY)
Admission: RE | Admit: 2020-06-18 | Discharge: 2020-06-18 | Disposition: A | Discharge status: Home / Self Care | Payer: Medicaid Other | Source: Ambulatory Visit | Attending: Urgent Care | Admitting: Urgent Care

## 2020-06-18 VITALS — BP 100/63 | HR 94 | Temp 99.2°F | Resp 16

## 2020-06-18 DIAGNOSIS — Z8619 Personal history of other infectious and parasitic diseases: Secondary | ICD-10-CM

## 2020-06-18 DIAGNOSIS — R350 Frequency of micturition: Secondary | ICD-10-CM | POA: Insufficient documentation

## 2020-06-18 DIAGNOSIS — Z3202 Encounter for pregnancy test, result negative: Secondary | ICD-10-CM

## 2020-06-18 DIAGNOSIS — Z7251 High risk heterosexual behavior: Secondary | ICD-10-CM | POA: Insufficient documentation

## 2020-06-18 DIAGNOSIS — R140 Abdominal distension (gaseous): Secondary | ICD-10-CM | POA: Insufficient documentation

## 2020-06-18 DIAGNOSIS — R3915 Urgency of urination: Secondary | ICD-10-CM | POA: Insufficient documentation

## 2020-06-18 DIAGNOSIS — R110 Nausea: Secondary | ICD-10-CM

## 2020-06-18 LAB — POCT URINALYSIS DIPSTICK, ED / UC
Bilirubin Urine: NEGATIVE
Glucose, UA: NEGATIVE mg/dL
Ketones, ur: NEGATIVE mg/dL
Leukocytes,Ua: NEGATIVE
Nitrite: NEGATIVE
Protein, ur: NEGATIVE mg/dL
Specific Gravity, Urine: 1.025 (ref 1.005–1.030)
Urobilinogen, UA: 0.2 mg/dL (ref 0.0–1.0)
pH: 7 (ref 5.0–8.0)

## 2020-06-18 LAB — POC URINE PREG, ED: Preg Test, Ur: NEGATIVE

## 2020-06-18 MED ORDER — METRONIDAZOLE 500 MG PO TABS
500.0000 mg | ORAL_TABLET | Freq: Two times a day (BID) | ORAL | 0 refills | Status: DC
Start: 2020-06-18 — End: 2020-10-20

## 2020-06-18 MED ORDER — ONDANSETRON 8 MG PO TBDP
8.0000 mg | ORAL_TABLET | Freq: Three times a day (TID) | ORAL | 0 refills | Status: DC | PRN
Start: 2020-06-18 — End: 2020-08-21

## 2020-06-18 NOTE — ED Triage Notes (Signed)
Patient reports she wants a pregnancy test .

## 2020-06-18 NOTE — ED Provider Notes (Signed)
[facility]   MRN: [id number] DOB: Apr 02, 1995  Subjective:   Yvonne Waller is a 25 y.o. female presenting for 1 week history of persistent nausea without vomiting, abdominal bloating, urinary frequency and urgency, intermittent low back pains. LMP was end of February 2022, was regular. Patient is sexually active, has unprotected sex with 1 partner. Had a miscarriage in October 2021.  She is trying to get pregnant really wants to know if she is in fact pregnant.  Has not taken a home pregnancy test.  Lastly, patient was tested for sexually transmitted infections in [place] at the end of last year and states that she was positive for trichomoniasis.  Unfortunately, she did not pick up the medication because she could not afford it.  Since then she has not sought treatment until today.  No current facility-administered medications for this encounter. No current outpatient medications on file.   No Known Allergies  Past Medical History:  Diagnosis Date  . Bipolar 1 disorder (HCC)   . Depression   . Herpes genitalis   . Hydrocephalus (HCC)   . Leukocytosis   . Seizures (HCC)      Past Surgical History:  Procedure Laterality Date  . TONSILLECTOMY      Family History  Problem Relation Age of Onset  . Healthy Mother   . Healthy Father     Social History   Tobacco Use  . Smoking status: Current Every Day Smoker  . Smokeless tobacco: Current User  Substance Use Topics  . Alcohol use: No  . Drug use: No    ROS   Objective:   Vitals: BP 100/63 (BP Location: Right Arm)   Pulse 94   Temp 99.2 F (37.3 C) (Oral)   Resp 16   LMP 05/21/2020   SpO2 99%   Physical Exam Constitutional:      General: She is not in acute distress.    Appearance: Normal appearance. She is well-developed. She is not ill-appearing, toxic-appearing or diaphoretic.  HENT:     Head: Normocephalic and atraumatic.     Nose: Nose normal.     Mouth/Throat:     Mouth: Mucous  membranes are moist.     Pharynx: Oropharynx is clear.  Eyes:     General: No scleral icterus.       Right eye: No discharge.        Left eye: No discharge.     Extraocular Movements: Extraocular movements intact.     Conjunctiva/sclera: Conjunctivae normal.     Pupils: Pupils are equal, round, and reactive to light.  Cardiovascular:     Rate and Rhythm: Normal rate.  Pulmonary:     Effort: Pulmonary effort is normal.  Abdominal:     General: Bowel sounds are normal. There is no distension.     Palpations: Abdomen is soft. There is no mass.     Tenderness: There is abdominal tenderness (right pelvic area). There is no right CVA tenderness, left CVA tenderness, guarding or rebound.  Skin:    General: Skin is warm and dry.  Neurological:     General: No focal deficit present.     Mental Status: She is alert and oriented to person, place, and time.  Psychiatric:        Mood and Affect: Mood normal.        Behavior: Behavior normal.        Thought Content: Thought content normal.        Judgment:  Judgment normal.     Results for orders placed or performed during the hospital encounter of 06/18/20 (from the past 24 hour(s))  POC Urinalysis dipstick     Status: Abnormal   Collection Time: 06/18/20  2:40 PM  Result Value Ref Range   Glucose, UA NEGATIVE NEGATIVE mg/dL   Bilirubin Urine NEGATIVE NEGATIVE   Ketones, ur NEGATIVE NEGATIVE mg/dL   Specific Gravity, Urine 1.025 1.005 - 1.030   Hgb urine dipstick TRACE (A) NEGATIVE   pH 7.0 5.0 - 8.0   Protein, ur NEGATIVE NEGATIVE mg/dL   Urobilinogen, UA 0.2 0.0 - 1.0 mg/dL   Nitrite NEGATIVE NEGATIVE   Leukocytes,Ua NEGATIVE NEGATIVE  POC urine pregnancy     Status: None   Collection Time: 06/18/20  2:43 PM  Result Value Ref Range   Preg Test, Ur NEGATIVE NEGATIVE    Assessment and Plan :   PDMP not reviewed this encounter.  1. Urinary frequency   2. History of trichomoniasis   3. Urinary urgency   4. Abdominal bloating    5. Nausea without vomiting   6. Unprotected sex     No suspicion for PID but counseled that this is a distinct possibility if she does not get treated for trichomoniasis.  Prescription sent to her pharmacy for Flagyl.  Use supportive care otherwise including daily adequate hydration, limiting urinary irritants, Zofran for nausea and vomiting, ibuprofen for pain.  Establish care with new gynecologist through the [facility]. Counseled patient on potential for adverse effects with medications prescribed/recommended today, ER and return-to-clinic precautions discussed, patient verbalized understanding.    Wallis Bamberg, PA-C 06/18/20 1459

## 2020-06-19 LAB — CERVICOVAGINAL ANCILLARY ONLY
Bacterial Vaginitis (gardnerella): POSITIVE — AB
Candida Glabrata: NEGATIVE
Candida Vaginitis: NEGATIVE
Chlamydia: NEGATIVE
Comment: NEGATIVE
Comment: NEGATIVE
Comment: NEGATIVE
Comment: NEGATIVE
Comment: NEGATIVE
Comment: NORMAL
Neisseria Gonorrhea: NEGATIVE
Trichomonas: POSITIVE — AB

## 2020-08-21 ENCOUNTER — Encounter (HOSPITAL_COMMUNITY): Payer: Self-pay

## 2020-08-21 ENCOUNTER — Emergency Department (HOSPITAL_COMMUNITY): Payer: Self-pay

## 2020-08-21 ENCOUNTER — Emergency Department (HOSPITAL_COMMUNITY)
Admission: EM | Admit: 2020-08-21 | Discharge: 2020-08-21 | Disposition: A | Discharge status: Home / Self Care | Payer: Self-pay | Attending: Emergency Medicine | Admitting: Emergency Medicine

## 2020-08-21 ENCOUNTER — Other Ambulatory Visit: Payer: Self-pay

## 2020-08-21 DIAGNOSIS — A599 Trichomoniasis, unspecified: Secondary | ICD-10-CM | POA: Insufficient documentation

## 2020-08-21 DIAGNOSIS — N390 Urinary tract infection, site not specified: Secondary | ICD-10-CM | POA: Insufficient documentation

## 2020-08-21 DIAGNOSIS — N7011 Chronic salpingitis: Secondary | ICD-10-CM | POA: Insufficient documentation

## 2020-08-21 DIAGNOSIS — F17200 Nicotine dependence, unspecified, uncomplicated: Secondary | ICD-10-CM | POA: Insufficient documentation

## 2020-08-21 DIAGNOSIS — B9689 Other specified bacterial agents as the cause of diseases classified elsewhere: Secondary | ICD-10-CM | POA: Insufficient documentation

## 2020-08-21 DIAGNOSIS — R52 Pain, unspecified: Secondary | ICD-10-CM

## 2020-08-21 LAB — COMPREHENSIVE METABOLIC PANEL
ALT: 10 U/L (ref 0–44)
AST: 13 U/L — ABNORMAL LOW (ref 15–41)
Albumin: 4.1 g/dL (ref 3.5–5.0)
Alkaline Phosphatase: 61 U/L (ref 38–126)
Anion gap: 9 (ref 5–15)
BUN: 12 mg/dL (ref 6–20)
CO2: 25 mmol/L (ref 22–32)
Calcium: 9.6 mg/dL (ref 8.9–10.3)
Chloride: 105 mmol/L (ref 98–111)
Creatinine, Ser: 0.75 mg/dL (ref 0.44–1.00)
GFR, Estimated: >60 mL/min (ref >60)
Glucose, Bld: 84 mg/dL (ref 70–99)
Potassium: 4 mmol/L (ref 3.5–5.1)
Sodium: 139 mmol/L (ref 135–145)
Total Bilirubin: 0.3 mg/dL (ref 0.3–1.2)
Total Protein: 7.4 g/dL (ref 6.5–8.1)

## 2020-08-21 LAB — I-STAT BETA HCG BLOOD, ED (MC, WL, AP ONLY): I-stat hCG, quantitative: <5 m[IU]/mL (ref <5)

## 2020-08-21 LAB — CBC WITH DIFFERENTIAL/PLATELET
Abs Immature Granulocytes: 0.09 10*3/uL — ABNORMAL HIGH (ref 0.00–0.07)
Basophils Absolute: 0.1 10*3/uL (ref 0.0–0.1)
Basophils Relative: 0 %
Eosinophils Absolute: 0.5 10*3/uL (ref 0.0–0.5)
Eosinophils Relative: 3 %
HCT: 43.2 % (ref 36.0–46.0)
Hemoglobin: 14.3 g/dL (ref 12.0–15.0)
Immature Granulocytes: 1 %
Lymphocytes Relative: 25 %
Lymphs Abs: 4.4 10*3/uL — ABNORMAL HIGH (ref 0.7–4.0)
MCH: 31.4 pg (ref 26.0–34.0)
MCHC: 33.1 g/dL (ref 30.0–36.0)
MCV: 94.7 fL (ref 80.0–100.0)
Monocytes Absolute: 1.7 10*3/uL — ABNORMAL HIGH (ref 0.1–1.0)
Monocytes Relative: 10 %
Neutro Abs: 11.1 10*3/uL — ABNORMAL HIGH (ref 1.7–7.7)
Neutrophils Relative %: 61 %
Platelets: 320 10*3/uL (ref 150–400)
RBC: 4.56 MIL/uL (ref 3.87–5.11)
RDW: 12.7 % (ref 11.5–15.5)
WBC: 17.8 10*3/uL — ABNORMAL HIGH (ref 4.0–10.5)
nRBC: 0 % (ref 0.0–0.2)

## 2020-08-21 LAB — URINALYSIS, ROUTINE W REFLEX MICROSCOPIC
Bilirubin Urine: NEGATIVE
Glucose, UA: NEGATIVE mg/dL
Ketones, ur: NEGATIVE mg/dL
Nitrite: POSITIVE — AB
Protein, ur: NEGATIVE mg/dL
Specific Gravity, Urine: 1.023 (ref 1.005–1.030)
pH: 6 (ref 5.0–8.0)

## 2020-08-21 LAB — WET PREP, GENITAL
Clue Cells Wet Prep HPF POC: NONE SEEN
Yeast Wet Prep HPF POC: NONE SEEN

## 2020-08-21 MED ORDER — ONDANSETRON 4 MG PO TBDP: 4.0000 mg | ORAL_TABLET | Freq: Three times a day (TID) | ORAL | 0 refills | Status: DC | PRN

## 2020-08-21 MED ORDER — NITROFURANTOIN MONOHYD MACRO 100 MG PO CAPS: 100.0000 mg | ORAL_CAPSULE | Freq: Two times a day (BID) | ORAL | 0 refills | Status: DC

## 2020-08-21 MED ORDER — CEFTRIAXONE SODIUM 1 G IJ SOLR
500.0000 mg | Freq: Once | INTRAMUSCULAR | Status: AC
  Administered 2020-08-21: 500 mg via INTRAMUSCULAR
  Filled 2020-08-21: qty 10

## 2020-08-21 MED ORDER — METRONIDAZOLE 500 MG PO TABS
2000.0000 mg | ORAL_TABLET | Freq: Once | ORAL | Status: AC
  Administered 2020-08-21: 2000 mg via ORAL
  Filled 2020-08-21: qty 4

## 2020-08-21 MED ORDER — DOXYCYCLINE HYCLATE 100 MG PO TABS
100.0000 mg | ORAL_TABLET | Freq: Once | ORAL | Status: AC
  Administered 2020-08-21: 100 mg via ORAL
  Filled 2020-08-21: qty 1

## 2020-08-21 MED ORDER — OXYCODONE-ACETAMINOPHEN 5-325 MG PO TABS
1.0000 | ORAL_TABLET | Freq: Once | ORAL | Status: AC
  Administered 2020-08-21: 1 via ORAL
  Filled 2020-08-21: qty 1

## 2020-08-21 MED ORDER — DOXYCYCLINE HYCLATE 100 MG PO CAPS: 100.0000 mg | ORAL_CAPSULE | Freq: Two times a day (BID) | ORAL | 0 refills | Status: DC

## 2020-08-21 MED ORDER — LIDOCAINE HCL (PF) 1 % IJ SOLN
5.0000 mL | Freq: Once | INTRAMUSCULAR | Status: AC
  Administered 2020-08-21: 5 mL
  Filled 2020-08-21: qty 30

## 2020-08-21 MED ORDER — NITROFURANTOIN MONOHYD MACRO 100 MG PO CAPS
100.0000 mg | ORAL_CAPSULE | Freq: Once | ORAL | Status: AC
  Administered 2020-08-21: 100 mg via ORAL
  Filled 2020-08-21 (×2): qty 1

## 2020-08-21 MED ORDER — ONDANSETRON 8 MG PO TBDP
8.0000 mg | ORAL_TABLET | Freq: Once | ORAL | Status: AC
Start: 2020-08-21 — End: 2020-08-21
  Administered 2020-08-21: 8 mg via ORAL
  Filled 2020-08-21: qty 1

## 2020-08-21 NOTE — ED Provider Notes (Signed)
[facility]-EMERGENCY DEPT Provider Note   CSN: [id number] Arrival date & time: 08/21/20  1450     History Chief Complaint  Patient presents with  . Abdominal Pain  . Emesis    Yvonne Waller is a 25 y.o. female.  The history is provided by the patient.  Abdominal Pain Pain location:  Generalized Pain quality: stabbing   Pain radiates to:  Does not radiate Pain severity:  Severe Onset quality:  Sudden Duration:  1 hour Timing:  Constant Progression:  Unchanged Chronicity:  New Context comment:  Occurred after sexual intercourse Relieved by:  Nothing Worsened by:  Nothing Ineffective treatments:  None tried Associated symptoms: nausea and vomiting   Associated symptoms: no chest pain, no chills, no cough, no diarrhea, no dysuria, no fever, no hematuria, no shortness of breath, no sore throat, no vaginal bleeding and no vaginal discharge   Associated symptoms comment:  LMP 3 weeks ago Emesis Associated symptoms: abdominal pain   Associated symptoms: no arthralgias, no chills, no cough, no diarrhea, no fever and no sore throat        Past Medical History:  Diagnosis Date  . Bipolar 1 disorder (HCC)   . Depression   . Herpes genitalis   . Hydrocephalus (HCC)   . Leukocytosis   . Seizures (HCC)     There are no problems to display for this patient.   Past Surgical History:  Procedure Laterality Date  . TONSILLECTOMY       OB History   No obstetric history on file.     Family History  Problem Relation Age of Onset  . Healthy Mother   . Healthy Father     Social History   Tobacco Use  . Smoking status: Current Every Day Smoker  . Smokeless tobacco: Current User  Vaping Use  . Vaping Use: Never used  Substance Use Topics  . Alcohol use: No  . Drug use: No    Home Medications Prior to Admission medications   Medication Sig Start Date End Date Taking? Authorizing Provider  metroNIDAZOLE (FLAGYL) 500 MG tablet Take 1  tablet (500 mg total) by mouth 2 (two) times daily with a meal. DO NOT CONSUME ALCOHOL WHILE TAKING THIS MEDICATION. 06/18/20   Wallis Bamberg, PA-C  ondansetron (ZOFRAN-ODT) 8 MG disintegrating tablet Take 1 tablet (8 mg total) by mouth every 8 (eight) hours as needed for nausea or vomiting. 06/18/20   Wallis Bamberg, PA-C    Allergies    Bee venom and Shrimp flavor  Review of Systems   Review of Systems  Constitutional: Negative for chills and fever.  HENT: Negative for ear pain and sore throat.   Eyes: Negative for pain and visual disturbance.  Respiratory: Negative for cough and shortness of breath.   Cardiovascular: Negative for chest pain and palpitations.  Gastrointestinal: Positive for abdominal pain, nausea and vomiting. Negative for diarrhea.  Genitourinary: Negative for dysuria, hematuria, vaginal bleeding and vaginal discharge.  Musculoskeletal: Negative for arthralgias and back pain.  Skin: Negative for color change and rash.  Neurological: Negative for seizures and syncope.  All other systems reviewed and are negative.   Physical Exam Updated Vital Signs BP (!) 132/106 (BP Location: Left Arm)   Pulse 97   Temp 98.2 F (36.8 C) (Oral)   Resp 16   Ht 5\' 4"  (1.626 m)   Wt 56.7 kg   LMP 07/24/2020   SpO2 100%   BMI 21.46 kg/m   Physical Exam Vitals  and nursing note reviewed.  HENT:     Head: Normocephalic and atraumatic.  Eyes:     General: No scleral icterus. Pulmonary:     Effort: Pulmonary effort is normal. No respiratory distress.  Abdominal:     Palpations: Abdomen is soft.     Tenderness: There is no abdominal tenderness.  Genitourinary:    Vagina: Normal.     Cervix: Cervical motion tenderness present. No discharge.     Uterus: Tender.      Adnexa: Right adnexa normal and left adnexa normal.  Musculoskeletal:     Cervical back: Normal range of motion.  Skin:    General: Skin is warm and dry.  Neurological:     Mental Status: She is alert.   Psychiatric:        Mood and Affect: Mood normal.     ED Results / Procedures / Treatments   Labs (all labs ordered are listed, but only abnormal results are displayed) Labs Reviewed  WET PREP, GENITAL - Abnormal; Notable for the following components:      Result Value   Trich, Wet Prep PRESENT (*)    WBC, Wet Prep HPF POC MODERATE (*)    All other components within normal limits  COMPREHENSIVE METABOLIC PANEL - Abnormal; Notable for the following components:   AST 13 (*)    All other components within normal limits  CBC WITH DIFFERENTIAL/PLATELET - Abnormal; Notable for the following components:   WBC 17.8 (*)    Neutro Abs 11.1 (*)    Lymphs Abs 4.4 (*)    Monocytes Absolute 1.7 (*)    Abs Immature Granulocytes 0.09 (*)    All other components within normal limits  URINALYSIS, ROUTINE W REFLEX MICROSCOPIC - Abnormal; Notable for the following components:   APPearance CLOUDY (*)    Hgb urine dipstick SMALL (*)    Nitrite POSITIVE (*)    Leukocytes,Ua TRACE (*)    Bacteria, UA MANY (*)    All other components within normal limits  I-STAT BETA HCG BLOOD, ED ([facility], [facility], [facility] ONLY)  GC/CHLAMYDIA PROBE AMP ([place]) NOT AT [facility]    EKG None  Radiology US PELVIC COMPLETE W TRANSVAGINAL AND TORSION R/O  Result Date: 08/21/2020 CLINICAL DATA:  Pelvic pain after intercourse EXAM: TRANSABDOMINAL AND TRANSVAGINAL ULTRASOUND OF PELVIS DOPPLER ULTRASOUND OF OVARIES TECHNIQUE: Both transabdominal and transvaginal ultrasound examinations of the pelvis were performed. Transabdominal technique was performed for global imaging of the pelvis including uterus, ovaries, adnexal regions, and pelvic cul-de-sac. It was necessary to proceed with endovaginal exam following the transabdominal exam to visualize the uterus, endometrium, adnexa and ovaries. Color and duplex Doppler ultrasound was utilized to evaluate blood flow to the ovaries. COMPARISON:  Ultrasound and CT 10/10/2019 FINDINGS: Uterus  Measurements: 8.5 x 3.3 x 6.2 cm = volume: 92 mL. No fibroids or other mass visualized. Endometrium Thickness: 8 mm.  No focal abnormality visualized. Right ovary Measurements: 2 x 1.4 x 1.5 cm = volume: 2.2 mL. Ovarian parenchyma with normal follicles. Dilated, tubular cystic focus in the right adnexa measuring approximately 3 x 1.9 x 1.6 cm without internal debris, septation or other concerning features. Left ovary Measurements: 2.5 x 1.8 x 3 cm = volume: 7.2 mL. Normal appearance/no adnexal mass. Pulsed Doppler evaluation of both ovaries demonstrates normal low-resistance arterial and venous waveforms. Other findings No abnormal pelvic free fluid. Incidental note made of dependently layering bladder debris. IMPRESSION: 1. Anechoic tubular structure in the right adnexa, consistent with hydrosalpinx.  No concerning features of pyosalpinx is sterility is not entirely ascertained on imaging. 2. Layering debris within the urinary bladder, correlate with urinalysis to exclude a cystitis. 3. No other acute pelvic findings. Electronically Signed   By: Kreg Shropshire M.D.   On: 08/21/2020 16:52    Procedures Procedures   Medications Ordered in ED Medications  nitrofurantoin (macrocrystal-monohydrate) (MACROBID) capsule 100 mg (has no administration in time range)  ondansetron (ZOFRAN-ODT) disintegrating tablet 8 mg (8 mg Oral Given 08/21/20 1646)  oxyCODONE-acetaminophen (PERCOCET/ROXICET) 5-325 MG per tablet 1 tablet (1 tablet Oral Given 08/21/20 1646)  metroNIDAZOLE (FLAGYL) tablet 2,000 mg (2,000 mg Oral Given 08/21/20 1804)  cefTRIAXone (ROCEPHIN) injection 500 mg (500 mg Intramuscular Given 08/21/20 1805)  doxycycline (VIBRA-TABS) tablet 100 mg (100 mg Oral Given 08/21/20 1804)  lidocaine (PF) (XYLOCAINE) 1 % injection 5 mL (5 mLs Infiltration Given 08/21/20 1805)    ED Course  I have reviewed the triage vital signs and the nursing notes.  Pertinent labs & imaging results that were available during my care  of the patient were reviewed by me and considered in my medical decision making (see chart for details).    MDM Rules/Calculators/A&P                          [name] scented to the ED complaining of lower abdominal pain that occurred after intercourse.  She was evaluated for pelvic infection, UTI, ovarian torsion, or other abnormality.  Equivocal finding of a hydrosalpinx.  Patient's exam was overall fairly benign, and not suggestive of pyosalpinx.  She was treated empirically for cervicitis given the presence of trichomonas.  She was also treated for a urinary tract infection. Final Clinical Impression(s) / ED Diagnoses Final diagnoses:  Trichomoniasis  Lower urinary tract infectious disease  Hydrosalpinx    Rx / DC Orders ED Discharge Orders         Ordered    ondansetron (ZOFRAN ODT) 4 MG disintegrating tablet  Every 8 hours PRN        08/21/20 1830    doxycycline (VIBRAMYCIN) 100 MG capsule  2 times daily        08/21/20 1830    nitrofurantoin, macrocrystal-monohydrate, (MACROBID) 100 MG capsule  2 times daily        08/21/20 1830           Koleen Distance, MD 08/21/20 [phone]

## 2020-08-21 NOTE — ED Triage Notes (Signed)
Patient states her and her significant other had sex 20 minutes ago and afterwards she had a sudden onset of abdominal pain and vomiting. Patient states, "I feel the same when I had a miscarriage." Patient denies any vaginal bleeding.  Patient states "I went to the store to buy a pregnancy test, but I have not taken it yet."

## 2020-08-21 NOTE — ED Provider Notes (Addendum)
Emergency Medicine Provider Triage Evaluation Note  Yvonne Waller 25 y.o. female was evaluated in triage.  Pt complains of lower abdominal pain that began about 20 minutes prior to ED arrival.  Patient reports that she and her significant other were in the middle of having intercourse and states that after they finished, she started having abdominal pain, vomiting.  She states that it is mostly in the lower abdomen.  She has had some nausea/vomiting.  She has no vaginal bleeding.  She states her last menstrual cycle was about 3 weeks ago.  She is not sure if she is pregnant.  No fevers, chest pain.   Review of Systems  Positive: ABd pain, voming Negative: Fevers, CP  Physical Exam  BP 134/82   Pulse 70   Temp 98.2 F (36.8 C) (Oral)   Resp 18   Ht 5\' 4"  (1.626 m)   Wt 65.8 kg   SpO2 100%   BMI 24.89 kg/m  Gen:   Awake, appears uncomfortable but NAD    HEENT:  Atraumatic  Resp:  Normal effort  Cardiac:  Normal rate  Abd:   Nondistended, abdomen soft.  Diffuse tenderness in the lower abdomen.  No rigidity, guarding. MSK:   Moves extremities without difficulty  Neuro:  Speech clear  Other:     Medical Decision Making  Medically screening exam initiated at 3:09 PM  Appropriate orders placed.  Bette Brienza was informed that the remainder of the evaluation will be completed by another provider, this initial triage assessment does not replace that evaluation. They are counseled that they will need to remain in the ED until the completion of their workup, including full H&P and results of any tests.  Risks of leaving the emergency department prior to completion of treatment were discussed. Patient was advised to inform ED staff if they are leaving before their treatment is complete. The patient acknowledged these risks and time was allowed for questions.     The patient appears stable so that the remainder of the MSE may be completed by another provider.  3:13 PM: Patient with  worsening abd pain. Notified charge RN [facility]) that patient needed to be seen in the main ED.    Clinical Impression  abd pain   Portions of this note were generated with Dragon dictation software. Dictation errors may occur despite best attempts at proofreading.      [facility], PA-C 08/21/20 1510    08/23/20, PA-C 08/21/20 1513    08/23/20, DO 08/21/20 1523

## 2020-08-21 NOTE — Discharge Instructions (Signed)
The right tube was slightly larger than normal.  This is something called hydrosalpinx.  It can be a normal finding, but it is hard to tell if it is infected or not based on ultrasound alone.  Therefore, if you do not get better after taking antibiotics, it is very important you follow-up with an OB/GYN or come back to the emergency department.

## 2020-08-22 LAB — GC/CHLAMYDIA PROBE AMP (~~LOC~~) NOT AT ARMC
Chlamydia: NEGATIVE
Comment: NEGATIVE
Comment: NORMAL
Neisseria Gonorrhea: NEGATIVE

## 2020-10-20 ENCOUNTER — Ambulatory Visit (HOSPITAL_COMMUNITY)
Admission: EM | Admit: 2020-10-20 | Discharge: 2020-10-20 | Disposition: A | Discharge status: Home / Self Care | Payer: Medicaid Other | Attending: Urgent Care | Admitting: Urgent Care

## 2020-10-20 ENCOUNTER — Encounter (HOSPITAL_COMMUNITY): Payer: Self-pay

## 2020-10-20 ENCOUNTER — Other Ambulatory Visit: Payer: Self-pay

## 2020-10-20 DIAGNOSIS — Z23 Encounter for immunization: Secondary | ICD-10-CM

## 2020-10-20 DIAGNOSIS — M79641 Pain in right hand: Secondary | ICD-10-CM

## 2020-10-20 DIAGNOSIS — W503XXA Accidental bite by another person, initial encounter: Secondary | ICD-10-CM

## 2020-10-20 MED ORDER — AMOXICILLIN-POT CLAVULANATE 875-125 MG PO TABS: 1.0000 | ORAL_TABLET | Freq: Two times a day (BID) | ORAL | 0 refills | Status: DC

## 2020-10-20 MED ORDER — NAPROXEN 500 MG PO TABS: 500.0000 mg | ORAL_TABLET | Freq: Two times a day (BID) | ORAL | 0 refills | Status: DC

## 2020-10-20 MED ORDER — TETANUS-DIPHTH-ACELL PERTUSSIS 5-2.5-18.5 LF-MCG/0.5 IM SUSY
0.5000 mL | PREFILLED_SYRINGE | Freq: Once | INTRAMUSCULAR | Status: AC
  Administered 2020-10-20: 0.5 mL via INTRAMUSCULAR

## 2020-10-20 MED ORDER — TETANUS-DIPHTH-ACELL PERTUSSIS 5-2.5-18.5 LF-MCG/0.5 IM SUSY
PREFILLED_SYRINGE | INTRAMUSCULAR | Status: AC
  Filled 2020-10-20: qty 0.5
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
## 2020-10-25 ENCOUNTER — Encounter (HOSPITAL_COMMUNITY): Payer: Self-pay

## 2020-10-25 ENCOUNTER — Emergency Department (HOSPITAL_COMMUNITY): Payer: Self-pay

## 2020-10-25 ENCOUNTER — Emergency Department (HOSPITAL_COMMUNITY)
Admission: EM | Admit: 2020-10-25 | Discharge: 2020-10-26 | Disposition: A | Discharge status: Home / Self Care | Payer: Self-pay | Attending: Emergency Medicine | Admitting: Emergency Medicine

## 2020-10-25 DIAGNOSIS — F17200 Nicotine dependence, unspecified, uncomplicated: Secondary | ICD-10-CM | POA: Insufficient documentation

## 2020-10-25 DIAGNOSIS — R1084 Generalized abdominal pain: Secondary | ICD-10-CM

## 2020-10-25 DIAGNOSIS — R451 Restlessness and agitation: Secondary | ICD-10-CM | POA: Insufficient documentation

## 2020-10-25 DIAGNOSIS — R569 Unspecified convulsions: Secondary | ICD-10-CM | POA: Insufficient documentation

## 2020-10-25 DIAGNOSIS — R109 Unspecified abdominal pain: Secondary | ICD-10-CM | POA: Insufficient documentation

## 2020-10-25 DIAGNOSIS — R410 Disorientation, unspecified: Secondary | ICD-10-CM | POA: Insufficient documentation

## 2020-10-25 LAB — URINALYSIS, ROUTINE W REFLEX MICROSCOPIC
Bilirubin Urine: NEGATIVE
Glucose, UA: NEGATIVE mg/dL
Hgb urine dipstick: NEGATIVE
Ketones, ur: NEGATIVE mg/dL
Nitrite: NEGATIVE
Protein, ur: NEGATIVE mg/dL
Specific Gravity, Urine: 1.01 (ref 1.005–1.030)
pH: 9 — ABNORMAL HIGH (ref 5.0–8.0)

## 2020-10-25 LAB — CBC WITH DIFFERENTIAL/PLATELET
Abs Immature Granulocytes: 0.12 10*3/uL — ABNORMAL HIGH (ref 0.00–0.07)
Basophils Absolute: 0.1 10*3/uL (ref 0.0–0.1)
Basophils Relative: 0 %
Eosinophils Absolute: 0.4 10*3/uL (ref 0.0–0.5)
Eosinophils Relative: 2 %
HCT: 43.7 % (ref 36.0–46.0)
Hemoglobin: 14.2 g/dL (ref 12.0–15.0)
Immature Granulocytes: 1 %
Lymphocytes Relative: 12 %
Lymphs Abs: 3 10*3/uL (ref 0.7–4.0)
MCH: 31.8 pg (ref 26.0–34.0)
MCHC: 32.5 g/dL (ref 30.0–36.0)
MCV: 97.8 fL (ref 80.0–100.0)
Monocytes Absolute: 1.7 10*3/uL — ABNORMAL HIGH (ref 0.1–1.0)
Monocytes Relative: 7 %
Neutro Abs: 19.5 10*3/uL — ABNORMAL HIGH (ref 1.7–7.7)
Neutrophils Relative %: 78 %
Platelets: 275 10*3/uL (ref 150–400)
RBC: 4.47 MIL/uL (ref 3.87–5.11)
RDW: 13.7 % (ref 11.5–15.5)
WBC: 24.9 10*3/uL — ABNORMAL HIGH (ref 4.0–10.5)
nRBC: 0 % (ref 0.0–0.2)

## 2020-10-25 LAB — COMPREHENSIVE METABOLIC PANEL
ALT: 8 U/L (ref 0–44)
AST: 18 U/L (ref 15–41)
Albumin: 4 g/dL (ref 3.5–5.0)
Alkaline Phosphatase: 52 U/L (ref 38–126)
Anion gap: 10 (ref 5–15)
BUN: 9 mg/dL (ref 6–20)
CO2: 26 mmol/L (ref 22–32)
Calcium: 9.8 mg/dL (ref 8.9–10.3)
Chloride: 104 mmol/L (ref 98–111)
Creatinine, Ser: 0.81 mg/dL (ref 0.44–1.00)
GFR, Estimated: >60 mL/min (ref >60)
Glucose, Bld: 95 mg/dL (ref 70–99)
Potassium: 4.4 mmol/L (ref 3.5–5.1)
Sodium: 140 mmol/L (ref 135–145)
Total Bilirubin: 0.7 mg/dL (ref 0.3–1.2)
Total Protein: 6.9 g/dL (ref 6.5–8.1)

## 2020-10-25 LAB — CBG MONITORING, ED: Glucose-Capillary: 108 mg/dL — ABNORMAL HIGH (ref 70–99)

## 2020-10-25 LAB — I-STAT BETA HCG BLOOD, ED (MC, WL, AP ONLY): I-stat hCG, quantitative: <5 m[IU]/mL (ref <5)

## 2020-10-25 LAB — ETHANOL: Alcohol, Ethyl (B): <10 mg/dL (ref <10)

## 2020-10-25 MED ORDER — STERILE WATER FOR INJECTION IJ SOLN
INTRAMUSCULAR | Status: AC
  Filled 2020-10-25: qty 10

## 2020-10-25 MED ORDER — DOXYCYCLINE HYCLATE 100 MG PO CAPS: 100.0000 mg | ORAL_CAPSULE | Freq: Two times a day (BID) | ORAL | 0 refills | Status: AC

## 2020-10-25 MED ORDER — ONDANSETRON HCL 4 MG/2ML IJ SOLN: 4.0000 mg | Freq: Once | INTRAMUSCULAR | Status: AC

## 2020-10-25 MED ORDER — DOXYCYCLINE HYCLATE 100 MG PO TABS
100.0000 mg | ORAL_TABLET | Freq: Once | ORAL | Status: AC
  Administered 2020-10-25: 100 mg via ORAL
  Filled 2020-10-25: qty 1

## 2020-10-25 MED ORDER — LORAZEPAM 2 MG/ML IJ SOLN
2.0000 mg | Freq: Once | INTRAMUSCULAR | Status: AC
  Administered 2020-10-25: 2 mg via INTRAVENOUS
  Filled 2020-10-25: qty 1

## 2020-10-25 MED ORDER — FENTANYL CITRATE (PF) 100 MCG/2ML IJ SOLN
50.0000 ug | Freq: Once | INTRAMUSCULAR | Status: AC
  Administered 2020-10-25: 50 ug via INTRAVENOUS
  Filled 2020-10-25: qty 2

## 2020-10-25 MED ORDER — METRONIDAZOLE 500 MG PO TABS: 500.0000 mg | ORAL_TABLET | Freq: Two times a day (BID) | ORAL | 0 refills | Status: AC

## 2020-10-25 MED ORDER — ZIPRASIDONE MESYLATE 20 MG IM SOLR
INTRAMUSCULAR | Status: AC
  Administered 2020-10-25: 20 mg via INTRAMUSCULAR
  Filled 2020-10-25: qty 20

## 2020-10-25 MED ORDER — ONDANSETRON HCL 4 MG/2ML IJ SOLN
INTRAMUSCULAR | Status: AC
  Administered 2020-10-25: 4 mg via INTRAVENOUS
  Filled 2020-10-25: qty 2

## 2020-10-25 MED ORDER — MORPHINE SULFATE (PF) 4 MG/ML IV SOLN
4.0000 mg | Freq: Once | INTRAVENOUS | Status: AC
  Administered 2020-10-25: 4 mg via INTRAVENOUS
  Filled 2020-10-25: qty 1

## 2020-10-25 MED ORDER — SODIUM CHLORIDE 0.9 % IV BOLUS
1000.0000 mL | Freq: Once | INTRAVENOUS | Status: AC
  Administered 2020-10-25: 1000 mL via INTRAVENOUS

## 2020-10-25 MED ORDER — MORPHINE SULFATE (PF) 4 MG/ML IV SOLN
4.0000 mg | Freq: Once | INTRAVENOUS | Status: AC
Start: 2020-10-25 — End: 2020-10-25
  Administered 2020-10-25: 4 mg via INTRAVENOUS
  Filled 2020-10-25: qty 1

## 2020-10-25 MED ORDER — IOHEXOL 300 MG/ML  SOLN
100.0000 mL | Freq: Once | INTRAMUSCULAR | Status: AC | PRN
  Administered 2020-10-25: 100 mL via INTRAVENOUS

## 2020-10-25 MED ORDER — METRONIDAZOLE 500 MG PO TABS
500.0000 mg | ORAL_TABLET | Freq: Once | ORAL | Status: AC
  Administered 2020-10-25: 500 mg via ORAL
  Filled 2020-10-25: qty 1

## 2020-10-25 MED ORDER — SODIUM CHLORIDE 0.9 % IV SOLN
1.0000 g | Freq: Once | INTRAVENOUS | Status: AC
  Administered 2020-10-25: 1 g via INTRAVENOUS
  Filled 2020-10-25: qty 10

## 2020-10-25 MED ORDER — ZIPRASIDONE MESYLATE 20 MG IM SOLR: 20.0000 mg | Freq: Once | INTRAMUSCULAR | Status: AC
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
## 2020-10-25 NOTE — ED Notes (Signed)
CT called at this time if possible to bump up pt to get CT soon as pt was actively seizing prior to coming to [facility]. Also, pt is still non verbal and occasionally screams and appears agitated while moving in bed occasionally. MD aware and has seen pt when pt becomes agitated. Pt then falls back to sleep. Continuous cardiac monitoring in place.

## 2020-10-25 NOTE — ED Provider Notes (Addendum)
[facility] EMERGENCY DEPARTMENT Provider Note   CSN: [id number] Arrival date & time: 10/25/20  1130     History Chief Complaint  Patient presents with   Seizures    Yvonne Waller is a 25 y.o. female.  Patient presents with chief complaint of recurrent seizure episode.  She was apparently in bed when her boyfriend realized she was having a seizure.  EMS providers were still seizing upon arrival she was given Versed IM prior to arrival here in the ER.  No additional history or review of systems able to be obtained from the patient himself as she appears postictal and agitated and confused.  Per EMS no reports given no fevers or cough or vomiting or diarrhea.       Past Medical History:  Diagnosis Date   Bipolar 1 disorder (HCC)    Depression    Herpes genitalis    Hydrocephalus (HCC)    Leukocytosis    Seizures (HCC)     There are no problems to display for this patient.   Past Surgical History:  Procedure Laterality Date   TONSILLECTOMY       OB History   No obstetric history on file.     Family History  Problem Relation Age of Onset   Healthy Mother    Healthy Father     Social History   Tobacco Use   Smoking status: Every Day   Smokeless tobacco: Current  Vaping Use   Vaping Use: Never used  Substance Use Topics   Alcohol use: No   Drug use: No    Home Medications Prior to Admission medications   Medication Sig Start Date End Date Taking? Authorizing Provider  naproxen (NAPROSYN) 500 MG tablet Take 1 tablet (500 mg total) by mouth 2 (two) times daily with a meal. 10/20/20   Wallis Bamberg, PA-C  ondansetron (ZOFRAN ODT) 4 MG disintegrating tablet Take 1 tablet (4 mg total) by mouth every 8 (eight) hours as needed for nausea or vomiting. 10/26/20   Elpidio Anis, PA-C    Allergies    Bee venom and Shrimp flavor  Review of Systems   Review of Systems  Unable to perform ROS: Mental status change   Physical Exam Updated Vital  Signs BP 101/68   Pulse 87   Temp 98.1 F (36.7 C) (Oral)   Resp 19   Ht 5\' 4"  (1.626 m)   Wt 56.7 kg   SpO2 96%   BMI 21.46 kg/m   Physical Exam Constitutional:      Appearance: Normal appearance.     Comments: Moderately obtunded  HENT:     Head: Normocephalic.     Nose: Nose normal.  Eyes:     Extraocular Movements: Extraocular movements intact.  Cardiovascular:     Rate and Rhythm: Normal rate.  Pulmonary:     Effort: Pulmonary effort is normal.  Musculoskeletal:        General: Normal range of motion.     Cervical back: Normal range of motion.  Neurological:     Comments: Appears confused but is awake.  Following commands intermittently.    ED Results / Procedures / Treatments   Labs (all labs ordered are listed, but only abnormal results are displayed) Labs Reviewed  CBC WITH DIFFERENTIAL/PLATELET - Abnormal; Notable for the following components:      Result Value   WBC 24.9 (*)    Neutro Abs 19.5 (*)    Monocytes Absolute 1.7 (*)  Abs Immature Granulocytes 0.12 (*)    All other components within normal limits  URINALYSIS, ROUTINE W REFLEX MICROSCOPIC - Abnormal; Notable for the following components:   APPearance HAZY (*)    pH 9.0 (*)    Leukocytes,Ua SMALL (*)    Bacteria, UA RARE (*)    All other components within normal limits  CBG MONITORING, ED - Abnormal; Notable for the following components:   Glucose-Capillary 108 (*)    All other components within normal limits  COMPREHENSIVE METABOLIC PANEL  ETHANOL  I-STAT BETA HCG BLOOD, ED ([facility], [facility], [facility] ONLY)    EKG None  Radiology No results found.  Procedures Procedures   Medications Ordered in ED Medications  sterile water (preservative free) injection (  Not Given 10/25/20 1256)  sodium chloride 0.9 % bolus 1,000 mL (0 mLs Intravenous Stopped 10/25/20 1241)  LORazepam (ATIVAN) injection 2 mg (2 mg Intravenous Given 10/25/20 1156)  ziprasidone (GEODON) injection 20 mg (20 mg Intramuscular  Given 10/25/20 1256)  ondansetron (ZOFRAN) injection 4 mg (4 mg Intravenous Given 10/25/20 1800)  fentaNYL (SUBLIMAZE) injection 50 mcg (50 mcg Intravenous Given 10/25/20 1814)  morphine 4 MG/ML injection 4 mg (4 mg Intravenous Given 10/25/20 2015)  morphine 4 MG/ML injection 4 mg (4 mg Intravenous Given 10/25/20 2131)  iohexol (OMNIPAQUE) 300 MG/ML solution 100 mL (100 mLs Intravenous Contrast Given 10/25/20 2215)  cefTRIAXone (ROCEPHIN) 1 g in sodium chloride 0.9 % 100 mL IVPB (0 g Intravenous Stopped 10/26/20 0049)  doxycycline (VIBRA-TABS) tablet 100 mg (100 mg Oral Given 10/25/20 2300)  metroNIDAZOLE (FLAGYL) tablet 500 mg (500 mg Oral Given 10/25/20 2300)    ED Course  I have reviewed the triage vital signs and the nursing notes.  Pertinent labs & imaging results that were available during my care of the patient were reviewed by me and considered in my medical decision making (see chart for details).    MDM Rules/Calculators/A&P                           Labs sent and unremarkable here in ER vital normal chemistry normal CBC normal.  Patient has episodes where she cannot thrashes around the bed and moans.  Her report states that she has a history of hydrocephalus in the past and has been noncompliant with her seizure medications.  We will pursue CT scan of the brain but patient required sedation for the scan, given Versed without significant improvement and then given Geodon and able to finally get a scan.  Scans consistent with chronic ventriculomegaly.  On reassessment patient still eyes closed, however on painful stimuli she will yell out "why are you pinching me."  However afterwards she will just fall back down closer arising, toss and turn.  Boyfriend is at bedside states that he thinks she may have used cocaine last night.  He states that she had a seizure last October with similar confusion afterwards.  Patient states that she does not know what seizure medication she was on before  and cannot answer why she stopped taking them.  Will be signed out to oncoming provider anticipate period of observation with hopeful discharge with improvement of symptoms.   Final Clinical Impression(s) / ED Diagnoses Final diagnoses:  Seizure-like activity (HCC)  Generalized abdominal pain    Rx / DC Orders ED Discharge Orders          Ordered    doxycycline (VIBRAMYCIN) 100 MG capsule  2 times  daily        10/25/20 2256    metroNIDAZOLE (FLAGYL) 500 MG tablet  2 times daily        10/25/20 2256    Ambulatory referral to Neurology       Comments: An appointment is requested in approximately: 1 week   10/25/20 2257             Cheryll Cockayne, MD 10/25/20 1500    Cheryll Cockayne, MD 11/24/20 2212

## 2020-10-25 NOTE — ED Notes (Addendum)
Pt was screaming and yelling in her room. Reports abdominal pain. Pt was given Fentanyl IVP at this time. Pt is now awake and able to answer some questions. Will continue to monitor.

## 2020-10-25 NOTE — ED Notes (Signed)
Pt continues to be asleep. Unable to follow commands or hold a conversation. Pt still screams occasionally and becomes agitated at times. Will continue to monitor.

## 2020-10-25 NOTE — Discharge Instructions (Signed)
You have been seen in the emergency department today for a seizure.  Your workup today including labs are within normal limits.  Please follow up with your doctor/neurologist as soon as possible regarding today's emergency department visit and your likely seizure.  As we have discussed it is very important that you do not drive until you have been seen and cleared by your neurologist.  You also have evidence of a pelvic infection. I am starting you on antibiotics for the next 2 weeks but it is important that you call the OB/Gyn doctors for follow up.   Please drink plenty of fluids, get plenty of sleep and avoid any alcohol or drug use Please return to the emergency department if you have any further seizures which do not respond to medications, or for any other symptoms per se concerning for yourself.

## 2020-10-25 NOTE — ED Notes (Signed)
Hx of seizures and has not been on seizure meds x 1 year. Pt smoked marijuana this AM and started vomiting per EMS. Pt then started having seizures while laying on bed at home. Multiple seizures per EMS. Pt was actively seizing upon EMS arrival to pt home. Bf called 911. 5mg  IM Versed given by EMS. Pt is alert to pain. Unable to answer questions. Pt has not opened her eyes but has occasional screaming. Appears agitated and moves around the bed some. Pt then falls back to sleep. MD at bedside. Cardiac monitoring in place.

## 2020-10-25 NOTE — ED Provider Notes (Signed)
Care assumed from Dr. Jacqulyn Bath, patient pending pelvic ultrasound to evaluate for possible PID, although plan is to treat for CT evidence of PID regardless of ultrasound findings.  Pelvic ultrasound shows dilated right fallopian tube felt to represent either chronic hydrosalpinx or sequelae of prior pyosalpinx.  She is discharged with prescriptions for doxycycline and metronidazole, follow-up with women's outpatient clinic.  Also referred to neurology regarding possible seizure.  Results for orders placed or performed during the hospital encounter of 10/25/20  CBC with Differential  Result Value Ref Range   WBC 24.9 (H) 4.0 - 10.5 K/uL   RBC 4.47 3.87 - 5.11 MIL/uL   Hemoglobin 14.2 12.0 - 15.0 g/dL   HCT 45.8 09.9 - 83.3 %   MCV 97.8 80.0 - 100.0 fL   MCH 31.8 26.0 - 34.0 pg   MCHC 32.5 30.0 - 36.0 g/dL   RDW 82.5 05.3 - 97.6 %   Platelets 275 150 - 400 K/uL   nRBC 0.0 0.0 - 0.2 %   Neutrophils Relative % 78 %   Neutro Abs 19.5 (H) 1.7 - 7.7 K/uL   Lymphocytes Relative 12 %   Lymphs Abs 3.0 0.7 - 4.0 K/uL   Monocytes Relative 7 %   Monocytes Absolute 1.7 (H) 0.1 - 1.0 K/uL   Eosinophils Relative 2 %   Eosinophils Absolute 0.4 0.0 - 0.5 K/uL   Basophils Relative 0 %   Basophils Absolute 0.1 0.0 - 0.1 K/uL   Immature Granulocytes 1 %   Abs Immature Granulocytes 0.12 (H) 0.00 - 0.07 K/uL  Comprehensive metabolic panel  Result Value Ref Range   Sodium 140 135 - 145 mmol/L   Potassium 4.4 3.5 - 5.1 mmol/L   Chloride 104 98 - 111 mmol/L   CO2 26 22 - 32 mmol/L   Glucose, Bld 95 70 - 99 mg/dL   BUN 9 6 - 20 mg/dL   Creatinine, Ser 7.34 0.44 - 1.00 mg/dL   Calcium 9.8 8.9 - 19.3 mg/dL   Total Protein 6.9 6.5 - 8.1 g/dL   Albumin 4.0 3.5 - 5.0 g/dL   AST 18 15 - 41 U/L   ALT 8 0 - 44 U/L   Alkaline Phosphatase 52 38 - 126 U/L   Total Bilirubin 0.7 0.3 - 1.2 mg/dL   GFR, Estimated >79 >02 mL/min   Anion gap 10 5 - 15  Ethanol  Result Value Ref Range   Alcohol, Ethyl (B) <10 <10  mg/dL  Urinalysis, Routine w reflex microscopic  Result Value Ref Range   Color, Urine YELLOW YELLOW   APPearance HAZY (A) CLEAR   Specific Gravity, Urine 1.010 1.005 - 1.030   pH 9.0 (H) 5.0 - 8.0   Glucose, UA NEGATIVE NEGATIVE mg/dL   Hgb urine dipstick NEGATIVE NEGATIVE   Bilirubin Urine NEGATIVE NEGATIVE   Ketones, ur NEGATIVE NEGATIVE mg/dL   Protein, ur NEGATIVE NEGATIVE mg/dL   Nitrite NEGATIVE NEGATIVE   Leukocytes,Ua SMALL (A) NEGATIVE   RBC / HPF 0-5 0 - 5 RBC/hpf   WBC, UA 11-20 0 - 5 WBC/hpf   Bacteria, UA RARE (A) NONE SEEN   Squamous Epithelial / LPF 0-5 0 - 5   Mucus PRESENT   CBG monitoring, ED  Result Value Ref Range   Glucose-Capillary 108 (H) 70 - 99 mg/dL  I-Stat beta hCG blood, ED  Result Value Ref Range   I-stat hCG, quantitative <5.0 <5 mIU/mL   Comment 3  CT Head Wo Contrast  Result Date: 10/25/2020 CLINICAL DATA:  Mental status change, unknown cause EXAM: CT HEAD WITHOUT CONTRAST TECHNIQUE: Contiguous axial images were obtained from the base of the skull through the vertex without intravenous contrast. COMPARISON:  None. FINDINGS: Brain: There is enlargement of the ventricles, particularly the lateral ventricles. There is no periventricular interstitial edema. Basal cisterns are patent. Cortical sulci at the vertex are not effaced. There is no acute intracranial hemorrhage or mass effect. Gray-white differentiation is preserved. Vascular: No hyperdense vessel or unexpected calcification. Skull: Calvarium is unremarkable. Sinuses/Orbits: Patchy mucosal thickening. No acute orbital abnormality. Other: Mastoid air cells are clear. IMPRESSION: No acute intracranial hemorrhage or evidence of acute infarction. Ventricular enlargement. This is likely nonacute given lack of periventricular edema and preserved basal cisterns and cortical sulci at the vertex. Electronically Signed   By: Guadlupe Spanish M.D.   On: 10/25/2020 14:36   US Transvaginal  Non-OB  Result Date: 10/26/2020 CLINICAL DATA:  25 year old female with pelvic pain. EXAM: TRANSABDOMINAL AND TRANSVAGINAL ULTRASOUND OF PELVIS DOPPLER ULTRASOUND OF OVARIES TECHNIQUE: Both transabdominal and transvaginal ultrasound examinations of the pelvis were performed. Transabdominal technique was performed for global imaging of the pelvis including uterus, ovaries, adnexal regions, and pelvic cul-de-sac. It was necessary to proceed with endovaginal exam following the transabdominal exam to visualize the endometrium and ovaries. Color and duplex Doppler ultrasound was utilized to evaluate blood flow to the ovaries. COMPARISON:  None. FINDINGS: Uterus Measurements: 7.9 x 3.8 x 5.1 cm = volume: 79 mL. The uterus is anteverted and appears unremarkable. Endometrium Thickness: 9 mm.  No focal abnormality visualized. Right ovary Measurements: 2.6 x 1.2 x 1.8 cm = volume: 3 mL. Normal appearance/no adnexal mass. There is a mildly dilated tubular structure in the region of the right adnexa measuring approximately 1 cm in diameter. There are several internal septations within this dilated tube which may be sequela prior inflammatory/infection and scarring or represent proteinaceous debris. Left ovary Measurements: 3.6 x 2.7 x 2.9 cm = volume: 14 mL. There is a 2.1 x 1.6 x 1.7 cm cyst in the left ovary. Pulsed Doppler evaluation of both ovaries demonstrates normal low-resistance arterial and venous waveforms. Other findings No abnormal free fluid. IMPRESSION: 1. Unremarkable uterus and endometrium. 2. A 2 cm left ovarian cyst.  Ovaries are otherwise unremarkable. 3. 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 Clinical correlation is recommended. Electronically Signed   By: Elgie Collard M.D.   On: 10/26/2020 00:09   US Pelvis Complete  Result Date: 10/26/2020 CLINICAL DATA:  25 year old female with pelvic pain.  EXAM: TRANSABDOMINAL AND TRANSVAGINAL ULTRASOUND OF PELVIS DOPPLER ULTRASOUND OF OVARIES TECHNIQUE: Both transabdominal and transvaginal ultrasound examinations of the pelvis were performed. Transabdominal technique was performed for global imaging of the pelvis including uterus, ovaries, adnexal regions, and pelvic cul-de-sac. It was necessary to proceed with endovaginal exam following the transabdominal exam to visualize the endometrium and ovaries. Color and duplex Doppler ultrasound was utilized to evaluate blood flow to the ovaries. COMPARISON:  None. FINDINGS: Uterus Measurements: 7.9 x 3.8 x 5.1 cm = volume: 79 mL. The uterus is anteverted and appears unremarkable. Endometrium Thickness: 9 mm.  No focal abnormality visualized. Right ovary Measurements: 2.6 x 1.2 x 1.8 cm = volume: 3 mL. Normal appearance/no adnexal mass. There is a mildly dilated tubular structure in the region of the right adnexa measuring approximately 1 cm in diameter. There are several internal septations  within this dilated tube which may be sequela prior inflammatory/infection and scarring or represent proteinaceous debris. Left ovary Measurements: 3.6 x 2.7 x 2.9 cm = volume: 14 mL. There is a 2.1 x 1.6 x 1.7 cm cyst in the left ovary. Pulsed Doppler evaluation of both ovaries demonstrates normal low-resistance arterial and venous waveforms. Other findings No abnormal free fluid. IMPRESSION: 1. Unremarkable uterus and endometrium. 2. A 2 cm left ovarian cyst.  Ovaries are otherwise unremarkable. 3. 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 Clinical correlation is recommended. Electronically Signed   By: Elgie CollardArash  Radparvar M.D.   On: 10/26/2020 00:09   CT ABDOMEN PELVIS W CONTRAST  Result Date: 10/25/2020 CLINICAL DATA:  25 year old female with acute abdominal pain. EXAM: CT ABDOMEN AND PELVIS WITH CONTRAST TECHNIQUE: Multidetector CT  imaging of the abdomen and pelvis was performed using the standard protocol following bolus administration of intravenous contrast. CONTRAST:  100mL OMNIPAQUE IOHEXOL 300 MG/ML  SOLN COMPARISON:  CT abdomen pelvis dated 10/10/2019. FINDINGS: Lower chest: The visualized lung bases are clear. No intra-abdominal free air. Probable trace free fluid in the pelvis. Hepatobiliary: No focal liver abnormality is seen. No gallstones, gallbladder wall thickening, or biliary dilatation. Pancreas: Unremarkable. No pancreatic ductal dilatation or surrounding inflammatory changes. Spleen: Normal in size without focal abnormality. Adrenals/Urinary Tract: The adrenal glands are unremarkable. There is no hydronephrosis on either side. There is symmetric enhancement of the kidneys. Subcentimeter right renal upper pole hypodense lesion is not characterized. The urinary bladder is partially distended. There is apparent diffuse thickening of the bladder wall which may be partly related to underdistention. Cystitis is not excluded. Correlation with urinalysis recommended. Stomach/Bowel: There is no bowel obstruction or active inflammation. The appendix is normal. Vascular/Lymphatic: The abdominal aorta and IVC unremarkable. No portal venous gas. There is no adenopathy. Reproductive: The uterus is anteverted and grossly unremarkable. There is a 2 cm left ovarian dominant follicle or cyst. Somewhat dilated fluid-filled tubular structures in the region of the right adnexa and posterior pelvis (59/3) may represent dilated fallopian tubes versus normal caliber small bowel. There is however diffuse stranding of the pelvic floor and adnexal region concerning for pelvic inflammatory disease. Correlation with clinical exam and further evaluation with ultrasound recommended. Other: None Musculoskeletal: No acute or significant osseous findings. IMPRESSION: 1. Findings concerning for pelvic inflammatory disease. Clinical correlation and further  evaluation with pelvic ultrasound recommended. 2. A 2 cm left ovarian dominant follicle or cyst. 3. No bowel obstruction. Normal appendix. Electronically Signed   By: Elgie CollardArash  Radparvar M.D.   On: 10/25/2020 22:44   US Art/Ven Flow Abd Pelv Doppler  Result Date: 10/26/2020 CLINICAL DATA:  25 year old female with pelvic pain. EXAM: TRANSABDOMINAL AND TRANSVAGINAL ULTRASOUND OF PELVIS DOPPLER ULTRASOUND OF OVARIES TECHNIQUE: Both transabdominal and transvaginal ultrasound examinations of the pelvis were performed. Transabdominal technique was performed for global imaging of the pelvis including uterus, ovaries, adnexal regions, and pelvic cul-de-sac. It was necessary to proceed with endovaginal exam following the transabdominal exam to visualize the endometrium and ovaries. Color and duplex Doppler ultrasound was utilized to evaluate blood flow to the ovaries. COMPARISON:  None. FINDINGS: Uterus Measurements: 7.9 x 3.8 x 5.1 cm = volume: 79 mL. The uterus is anteverted and appears unremarkable. Endometrium Thickness: 9 mm.  No focal abnormality visualized. Right ovary Measurements: 2.6 x 1.2 x 1.8 cm = volume: 3 mL. Normal appearance/no adnexal mass. There is a mildly dilated tubular structure in  the region of the right adnexa measuring approximately 1 cm in diameter. There are several internal septations within this dilated tube which may be sequela prior inflammatory/infection and scarring or represent proteinaceous debris. Left ovary Measurements: 3.6 x 2.7 x 2.9 cm = volume: 14 mL. There is a 2.1 x 1.6 x 1.7 cm cyst in the left ovary. Pulsed Doppler evaluation of both ovaries demonstrates normal low-resistance arterial and venous waveforms. Other findings No abnormal free fluid. IMPRESSION: 1. Unremarkable uterus and endometrium. 2. A 2 cm left ovarian cyst.  Ovaries are otherwise unremarkable. 3. 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 Clinical correlation is recommended. Electronically Signed   By: Elgie Collard M.D.   On: 10/26/2020 00:09      Dione Booze, MD 10/26/20 [phone]

## 2020-10-25 NOTE — ED Notes (Signed)
Pt is now awake and started screaming and vomiting. MD notified. IVP Zofran given. Pt is screaming and crying for pain. Reports abdominal pain at this time. MD notified.

## 2020-10-25 NOTE — ED Provider Notes (Signed)
Blood pressure (!) 123/102, pulse 77, temperature 98.1 F (36.7 C), temperature source Axillary, resp. rate 15, height 5\' 4"  (1.626 m), weight 56.7 kg, SpO2 100 %.  Assuming care from Dr. .  In short, Yvonne Waller is a 25 y.o. female with a chief complaint of Seizures .  Refer to the original H&P for additional details.  The current plan of care is to follow up once clinically sober. Patient required Geodon and boyfriend endorses some cocaine use last night. Patient is somnolent. No clinical suspicion for status. Patient responding but not fully up and awake.   06:05 PM  Called the patient's bedside.  She is now awake and writhing in the bed.  She is holding her abdomen saying she is having abdominal pain.  She seem to be waking up slightly earlier according to the nurse but had some gagging type symptoms but was more awake than before.  She was given Zofran but is now crying in pain.  We will give a small dose of fentanyl.  This behavior does not seem consistent with seizure.  She is able to tell me her name and that she has been using drugs including cocaine.  She is able to tell me she is not taken any seizure medicine for the last year and does not have an outpatient neurologist.  The nurse tells me that she was very agitated before but this is much more awake, alert, conversational which was not the case when she initially received Geodon and ativan.   10:54 PM  Patient looking much more comfortable. CT with concern for possible PID. No active discharge. No fever. Plan to cover with abx and send for 30. If negative, will continue Doxycycline and Flagyl and f/u with Gyn and Neuro.   Care transferred to Dr. [place].    Preston Fleeting, MD 10/25/20 [phone]

## 2020-10-25 NOTE — ED Notes (Signed)
Patient transported to CT 

## 2020-10-25 NOTE — ED Notes (Addendum)
Taken to CT by CT tech.

## 2020-10-25 NOTE — ED Notes (Signed)
Pt has been more agitated and moving on bed frequently. Pt has been screaming. MD notified.

## 2020-10-26 ENCOUNTER — Emergency Department (HOSPITAL_COMMUNITY)
Admission: EM | Admit: 2020-10-26 | Discharge: 2020-10-26 | Disposition: A | Discharge status: Home / Self Care | Payer: Medicaid Other | Attending: Emergency Medicine | Admitting: Emergency Medicine

## 2020-10-26 ENCOUNTER — Other Ambulatory Visit: Payer: Self-pay

## 2020-10-26 DIAGNOSIS — R109 Unspecified abdominal pain: Secondary | ICD-10-CM | POA: Insufficient documentation

## 2020-10-26 DIAGNOSIS — R110 Nausea: Secondary | ICD-10-CM

## 2020-10-26 DIAGNOSIS — R1084 Generalized abdominal pain: Secondary | ICD-10-CM

## 2020-10-26 DIAGNOSIS — R112 Nausea with vomiting, unspecified: Secondary | ICD-10-CM | POA: Insufficient documentation

## 2020-10-26 DIAGNOSIS — F17220 Nicotine dependence, chewing tobacco, uncomplicated: Secondary | ICD-10-CM | POA: Insufficient documentation

## 2020-10-26 MED ORDER — ONDANSETRON HCL 4 MG/2ML IJ SOLN: 4.0000 mg | Freq: Once | INTRAMUSCULAR | Status: DC

## 2020-10-26 MED ORDER — NAPROXEN 250 MG PO TABS
500.0000 mg | ORAL_TABLET | Freq: Once | ORAL | Status: AC
  Administered 2020-10-26: 500 mg via ORAL
  Filled 2020-10-26: qty 2

## 2020-10-26 MED ORDER — ONDANSETRON 4 MG PO TBDP: 4.0000 mg | ORAL_TABLET | Freq: Three times a day (TID) | ORAL | 0 refills | Status: DC | PRN

## 2020-10-26 MED ORDER — ONDANSETRON 4 MG PO TBDP
ORAL_TABLET | ORAL | Status: AC
  Administered 2020-10-26: 4 mg
  Filled 2020-10-26: qty 1

## 2020-10-26 NOTE — ED Triage Notes (Signed)
Pt was BIB [facility] from home. Call out d/t seizure like activity. EMS states pt was responsive while having generalized body shaking. Pt continues to complain of abdominal pain. Discharge from ED today. 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

## 2020-10-26 NOTE — ED Notes (Signed)
Discharge instructions discussed with pt. Pt verbalized understanding with no questions at this time. Pt waiting on ride from lobby

## 2020-10-26 NOTE — Discharge Instructions (Addendum)
Follow up with your doctor as referred on most recent ED evaluation. Try to have your prescriptions filled and take as directed for pain, nausea and infection.

## 2020-10-26 NOTE — ED Provider Notes (Signed)
[facility] Provider Note   CSN: [id number] Arrival date & time: 10/26/20  [id number]     History Chief Complaint  Patient presents with   Seizure Like Activity     Yvonne Waller is a 25 y.o. female.  Patient to ED for further management of abdominal pain and reporting nausea with vomiting. Seen earlier today with full evaluation that found evidence concerning for PID on CT followed by US. No fever. History of pseudo-szs, substance abuse. She returns with complaint that her pain is not controlled and she continues to have emesis. She did not fill her prescriptions and states she will not be able to afford them.   The history is provided by the patient. No language interpreter was used.      Past Medical History:  Diagnosis Date   Bipolar 1 disorder (HCC)    Depression    Herpes genitalis    Hydrocephalus (HCC)    Leukocytosis    Seizures (HCC)     There are no problems to display for this patient.   Past Surgical History:  Procedure Laterality Date   TONSILLECTOMY       OB History   No obstetric history on file.     Family History  Problem Relation Age of Onset   Healthy Mother    Healthy Father     Social History   Tobacco Use   Smoking status: Every Day   Smokeless tobacco: Current  Vaping Use   Vaping Use: Never used  Substance Use Topics   Alcohol use: No   Drug use: No    Home Medications Prior to Admission medications   Medication Sig Start Date End Date Taking? Authorizing Provider  doxycycline (VIBRAMYCIN) 100 MG capsule Take 1 capsule (100 mg total) by mouth 2 (two) times daily for 14 days. 10/25/20 11/08/20  Long, Arlyss RepressJoshua G, MD  metroNIDAZOLE (FLAGYL) 500 MG tablet Take 1 tablet (500 mg total) by mouth 2 (two) times daily for 14 days. 10/25/20 11/08/20  Long, Arlyss RepressJoshua G, MD  naproxen (NAPROSYN) 500 MG tablet Take 1 tablet (500 mg total) by mouth 2 (two) times daily with a meal. 10/20/20   Wallis BambergMani, Mario, PA-C     Allergies    Bee venom and Shrimp flavor  Review of Systems   Review of Systems  Constitutional:  Negative for chills and fever.  HENT: Negative.    Respiratory: Negative.    Cardiovascular: Negative.   Gastrointestinal:  Positive for abdominal pain and vomiting.  Musculoskeletal: Negative.   Skin: Negative.   Neurological: Negative.    Physical Exam Updated Vital Signs BP 120/78 (BP Location: Right Arm)   Pulse 95   Temp 98.9 F (37.2 C) (Oral)   Resp 20   SpO2 97%   Physical Exam Vitals and nursing note reviewed.  Constitutional:      General: She is not in acute distress.    Appearance: She is well-developed. She is not ill-appearing.  Pulmonary:     Effort: Pulmonary effort is normal.  Abdominal:     General: There is no distension.     Palpations: Abdomen is soft.     Comments: Tender to any palpation.  Musculoskeletal:        General: Normal range of motion.     Cervical back: Normal range of motion.  Skin:    General: Skin is warm and dry.  Neurological:     Mental Status: She is alert and oriented to person,  place, and time.    ED Results / Procedures / Treatments   Labs (all labs ordered are listed, but only abnormal results are displayed) Labs Reviewed - No data to display  EKG None  Radiology CT Head Wo Contrast  Result Date: 10/25/2020 CLINICAL DATA:  Mental status change, unknown cause EXAM: CT HEAD WITHOUT CONTRAST TECHNIQUE: Contiguous axial images were obtained from the base of the skull through the vertex without intravenous contrast. COMPARISON:  None. FINDINGS: Brain: There is enlargement of the ventricles, particularly the lateral ventricles. There is no periventricular interstitial edema. Basal cisterns are patent. Cortical sulci at the vertex are not effaced. There is no acute intracranial hemorrhage or mass effect. Gray-white differentiation is preserved. Vascular: No hyperdense vessel or unexpected calcification. Skull: Calvarium is  unremarkable. Sinuses/Orbits: Patchy mucosal thickening. No acute orbital abnormality. Other: Mastoid air cells are clear. IMPRESSION: No acute intracranial hemorrhage or evidence of acute infarction. Ventricular enlargement. This is likely nonacute given lack of periventricular edema and preserved basal cisterns and cortical sulci at the vertex. Electronically Signed   By: Guadlupe Spanish M.D.   On: 10/25/2020 14:36   US Transvaginal Non-OB  Result Date: 10/26/2020 CLINICAL DATA:  25 year old female with pelvic pain. EXAM: TRANSABDOMINAL AND TRANSVAGINAL ULTRASOUND OF PELVIS DOPPLER ULTRASOUND OF OVARIES TECHNIQUE: Both transabdominal and transvaginal ultrasound examinations of the pelvis were performed. Transabdominal technique was performed for global imaging of the pelvis including uterus, ovaries, adnexal regions, and pelvic cul-de-sac. It was necessary to proceed with endovaginal exam following the transabdominal exam to visualize the endometrium and ovaries. Color and duplex Doppler ultrasound was utilized to evaluate blood flow to the ovaries. COMPARISON:  None. FINDINGS: Uterus Measurements: 7.9 x 3.8 x 5.1 cm = volume: 79 mL. The uterus is anteverted and appears unremarkable. Endometrium Thickness: 9 mm.  No focal abnormality visualized. Right ovary Measurements: 2.6 x 1.2 x 1.8 cm = volume: 3 mL. Normal appearance/no adnexal mass. There is a mildly dilated tubular structure in the region of the right adnexa measuring approximately 1 cm in diameter. There are several internal septations within this dilated tube which may be sequela prior inflammatory/infection and scarring or represent proteinaceous debris. Left ovary Measurements: 3.6 x 2.7 x 2.9 cm = volume: 14 mL. There is a 2.1 x 1.6 x 1.7 cm cyst in the left ovary. Pulsed Doppler evaluation of both ovaries demonstrates normal low-resistance arterial and venous waveforms. Other findings No abnormal free fluid. IMPRESSION: 1. Unremarkable uterus and  endometrium. 2. A 2 cm left ovarian cyst.  Ovaries are otherwise unremarkable. 3. 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 Clinical correlation is recommended. Electronically Signed   By: Elgie Collard M.D.   On: 10/26/2020 00:09   US Pelvis Complete  Result Date: 10/26/2020 CLINICAL DATA:  25 year old female with pelvic pain. EXAM: TRANSABDOMINAL AND TRANSVAGINAL ULTRASOUND OF PELVIS DOPPLER ULTRASOUND OF OVARIES TECHNIQUE: Both transabdominal and transvaginal ultrasound examinations of the pelvis were performed. Transabdominal technique was performed for global imaging of the pelvis including uterus, ovaries, adnexal regions, and pelvic cul-de-sac. It was necessary to proceed with endovaginal exam following the transabdominal exam to visualize the endometrium and ovaries. Color and duplex Doppler ultrasound was utilized to evaluate blood flow to the ovaries. COMPARISON:  None. FINDINGS: Uterus Measurements: 7.9 x 3.8 x 5.1 cm = volume: 79 mL. The uterus is anteverted and appears unremarkable. Endometrium Thickness: 9 mm.  No focal abnormality visualized. Right ovary Measurements: 2.6  x 1.2 x 1.8 cm = volume: 3 mL. Normal appearance/no adnexal mass. There is a mildly dilated tubular structure in the region of the right adnexa measuring approximately 1 cm in diameter. There are several internal septations within this dilated tube which may be sequela prior inflammatory/infection and scarring or represent proteinaceous debris. Left ovary Measurements: 3.6 x 2.7 x 2.9 cm = volume: 14 mL. There is a 2.1 x 1.6 x 1.7 cm cyst in the left ovary. Pulsed Doppler evaluation of both ovaries demonstrates normal low-resistance arterial and venous waveforms. Other findings No abnormal free fluid. IMPRESSION: 1. Unremarkable uterus and endometrium. 2. A 2 cm left ovarian cyst.  Ovaries are otherwise unremarkable. 3. 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 Clinical correlation is recommended. Electronically Signed   By: Elgie Collard M.D.   On: 10/26/2020 00:09   CT ABDOMEN PELVIS W CONTRAST  Result Date: 10/25/2020 CLINICAL DATA:  25 year old female with acute abdominal pain. EXAM: CT ABDOMEN AND PELVIS WITH CONTRAST TECHNIQUE: Multidetector CT imaging of the abdomen and pelvis was performed using the standard protocol following bolus administration of intravenous contrast. CONTRAST:  OMNIPAQUE IOHEXOL 300 MG/ML  SOLN COMPARISON:  CT abdomen pelvis dated 10/10/2019. FINDINGS: Lower chest: The visualized lung bases are clear. No intra-abdominal free air. Probable trace free fluid in the pelvis. Hepatobiliary: No focal liver abnormality is seen. No gallstones, gallbladder wall thickening, or biliary dilatation. Pancreas: Unremarkable. No pancreatic ductal dilatation or surrounding inflammatory changes. Spleen: Normal in size without focal abnormality. Adrenals/Urinary Tract: The adrenal glands are unremarkable. There is no hydronephrosis on either side. There is symmetric enhancement of the kidneys. Subcentimeter right renal upper pole hypodense lesion is not characterized. The urinary bladder is partially distended. There is apparent diffuse thickening of the bladder wall which may be partly related to underdistention. Cystitis is not excluded. Correlation with urinalysis recommended. Stomach/Bowel: There is no bowel obstruction or active inflammation. The appendix is normal. Vascular/Lymphatic: The abdominal aorta and IVC unremarkable. No portal venous gas. There is no adenopathy. Reproductive: The uterus is anteverted and grossly unremarkable. There is a 2 cm left ovarian dominant follicle or cyst. Somewhat dilated fluid-filled tubular structures in the region of the right adnexa and posterior pelvis (59/3) may represent  dilated fallopian tubes versus normal caliber small bowel. There is however diffuse stranding of the pelvic floor and adnexal region concerning for pelvic inflammatory disease. Correlation with clinical exam and further evaluation with ultrasound recommended. Other: None Musculoskeletal: No acute or significant osseous findings. IMPRESSION: 1. Findings concerning for pelvic inflammatory disease. Clinical correlation and further evaluation with pelvic ultrasound recommended. 2. A 2 cm left ovarian dominant follicle or cyst. 3. No bowel obstruction. Normal appendix. Electronically Signed   By: Elgie Collard M.D.   On: 10/25/2020 22:44   [place] Art/Ven Flow Abd Pelv Doppler  Result Date: 10/26/2020 CLINICAL DATA:  25 year old female with pelvic pain. EXAM: TRANSABDOMINAL AND TRANSVAGINAL ULTRASOUND OF PELVIS DOPPLER ULTRASOUND OF OVARIES TECHNIQUE: Both transabdominal and transvaginal ultrasound examinations of the pelvis were performed. Transabdominal technique was performed for global imaging of the pelvis including uterus, ovaries, adnexal regions, and pelvic cul-de-sac. It was necessary to proceed with endovaginal exam following the transabdominal exam to visualize the endometrium and ovaries. Color and duplex Doppler ultrasound was utilized to evaluate blood flow to the ovaries. COMPARISON:  None. FINDINGS: Uterus Measurements: 7.9 x 3.8 x 5.1 cm = volume: 79 mL. The uterus is  anteverted and appears unremarkable. Endometrium Thickness: 9 mm.  No focal abnormality visualized. Right ovary Measurements: 2.6 x 1.2 x 1.8 cm = volume: 3 mL. Normal appearance/no adnexal mass. There is a mildly dilated tubular structure in the region of the right adnexa measuring approximately 1 cm in diameter. There are several internal septations within this dilated tube which may be sequela prior inflammatory/infection and scarring or represent proteinaceous debris. Left ovary Measurements: 3.6 x 2.7 x 2.9 cm = volume: 14 mL. There  is a 2.1 x 1.6 x 1.7 cm cyst in the left ovary. Pulsed Doppler evaluation of both ovaries demonstrates normal low-resistance arterial and venous waveforms. Other findings No abnormal free fluid. IMPRESSION: 1. Unremarkable uterus and endometrium. 2. A 2 cm left ovarian cyst.  Ovaries are otherwise unremarkable. 3. 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 Clinical correlation is recommended. Electronically Signed   By: Elgie Collard M.D.   On: 10/26/2020 00:09    Procedures Procedures   Medications Ordered in ED Medications  ondansetron (ZOFRAN) injection 4 mg (has no administration in time range)  naproxen (NAPROSYN) tablet 500 mg (has no administration in time range)    ED Course  I have reviewed the triage vital signs and the nursing notes.  Pertinent labs & imaging results that were available during my care of the patient were reviewed by me and considered in my medical decision making (see chart for details).    MDM Rules/Calculators/A&P                           Patient to ED with c/o uncontrolled pain after being seen in the ED earlier today. She reports persistent vomiting as well.   Patient sitting in her wheelchair quietly. VSS. Chart reviewed. She was discharged home with Rx Flagyl, Doxycycline and Naproxen. She is requesting a dose of medication for pain because she just wants to go home and sleep.   Further work up was offered. She states she does not feel she needs further evaluation because a diagnosis has been determined. A dose of her Naproxen was provided. Zofran was given for nausea, although, no vomiting seen in the ED.  Final Clinical Impression(s) / ED Diagnoses Final diagnoses:  None   Abdominal pain Nausea   Rx / DC Orders ED Discharge Orders     None        Danne Harbor 10/26/20 0401    Dione Booze, MD 10/26/20 [phone]

## 2021-05-01 ENCOUNTER — Other Ambulatory Visit: Payer: Self-pay

## 2021-05-01 ENCOUNTER — Emergency Department (HOSPITAL_COMMUNITY)
Admission: EM | Admit: 2021-05-01 | Discharge: 2021-05-01 | Disposition: A | Discharge status: Home / Self Care | Payer: Medicaid Other | Attending: Emergency Medicine | Admitting: Emergency Medicine

## 2021-05-01 DIAGNOSIS — D72829 Elevated white blood cell count, unspecified: Secondary | ICD-10-CM | POA: Insufficient documentation

## 2021-05-01 DIAGNOSIS — L02412 Cutaneous abscess of left axilla: Secondary | ICD-10-CM

## 2021-05-01 LAB — CBC WITH DIFFERENTIAL/PLATELET
Abs Immature Granulocytes: 0 10*3/uL (ref 0.00–0.07)
Basophils Absolute: 0 10*3/uL (ref 0.0–0.1)
Basophils Relative: 0 %
Eosinophils Absolute: 0 10*3/uL (ref 0.0–0.5)
Eosinophils Relative: 0 %
HCT: 45.8 % (ref 36.0–46.0)
Hemoglobin: 14.9 g/dL (ref 12.0–15.0)
Lymphocytes Relative: 14 %
Lymphs Abs: 3.6 10*3/uL (ref 0.7–4.0)
MCH: 31.8 pg (ref 26.0–34.0)
MCHC: 32.5 g/dL (ref 30.0–36.0)
MCV: 97.7 fL (ref 80.0–100.0)
Monocytes Absolute: 1 10*3/uL (ref 0.1–1.0)
Monocytes Relative: 4 %
Neutro Abs: 20.8 10*3/uL — ABNORMAL HIGH (ref 1.7–7.7)
Neutrophils Relative %: 82 %
Platelets: 283 10*3/uL (ref 150–400)
RBC: 4.69 MIL/uL (ref 3.87–5.11)
RDW: 12.7 % (ref 11.5–15.5)
WBC: 25.4 10*3/uL — ABNORMAL HIGH (ref 4.0–10.5)
nRBC: 0 % (ref 0.0–0.2)
nRBC: 0 /100 WBC

## 2021-05-01 LAB — BASIC METABOLIC PANEL
Anion gap: 11 (ref 5–15)
BUN: 8 mg/dL (ref 6–20)
CO2: 24 mmol/L (ref 22–32)
Calcium: 9.6 mg/dL (ref 8.9–10.3)
Chloride: 102 mmol/L (ref 98–111)
Creatinine, Ser: 0.76 mg/dL (ref 0.44–1.00)
GFR, Estimated: >60 mL/min (ref >60)
Glucose, Bld: 120 mg/dL — ABNORMAL HIGH (ref 70–99)
Potassium: 3.6 mmol/L (ref 3.5–5.1)
Sodium: 137 mmol/L (ref 135–145)

## 2021-05-01 LAB — I-STAT BETA HCG BLOOD, ED (MC, WL, AP ONLY): I-stat hCG, quantitative: <5 m[IU]/mL (ref <5)

## 2021-05-01 LAB — LACTIC ACID, PLASMA: Lactic Acid, Venous: 1.3 mmol/L (ref 0.5–1.9)

## 2021-05-01 MED ORDER — ONDANSETRON HCL 4 MG/2ML IJ SOLN
4.0000 mg | Freq: Once | INTRAMUSCULAR | Status: AC
  Administered 2021-05-01: 4 mg via INTRAVENOUS
  Filled 2021-05-01: qty 2

## 2021-05-01 MED ORDER — HYDROMORPHONE HCL 1 MG/ML IJ SOLN
1.0000 mg | Freq: Once | INTRAMUSCULAR | Status: AC
  Administered 2021-05-01: 1 mg via INTRAVENOUS
  Filled 2021-05-01: qty 1

## 2021-05-01 MED ORDER — DOXYCYCLINE HYCLATE 100 MG PO CAPS: 100.0000 mg | ORAL_CAPSULE | Freq: Two times a day (BID) | ORAL | 0 refills | Status: DC

## 2021-05-01 MED ORDER — LIDOCAINE HCL (PF) 1 % IJ SOLN
10.0000 mL | Freq: Once | INTRAMUSCULAR | Status: AC
  Administered 2021-05-01: 10 mL
  Filled 2021-05-01: qty 10

## 2021-05-01 MED ORDER — OXYCODONE-ACETAMINOPHEN 5-325 MG PO TABS
1.0000 | ORAL_TABLET | Freq: Once | ORAL | Status: AC
  Administered 2021-05-01: 1 via ORAL
  Filled 2021-05-01: qty 1

## 2021-05-01 MED ORDER — SODIUM CHLORIDE 0.9 % IV SOLN
1.0000 g | Freq: Once | INTRAVENOUS | Status: AC
  Administered 2021-05-01: 1 g via INTRAVENOUS
  Filled 2021-05-01: qty 10

## 2021-05-01 NOTE — ED Provider Notes (Signed)
[facility] EMERGENCY DEPARTMENT Provider Note   CSN: [id number] Arrival date & time: 05/01/21  1456     History  No chief complaint on file.   Yvonne Waller is a 26 y.o. female.  The history is provided by the patient. No language interpreter was used.  Abscess Location:  Shoulder/arm Shoulder/arm abscess location:  L axilla Size:  10x8 Abscess quality: redness and warmth   Red streaking: yes   Duration:  3 days Progression:  Worsening Chronicity:  New Relieved by:  Nothing Worsened by:  Nothing Ineffective treatments:  None tried Associated symptoms: no vomiting       Home Medications Prior to Admission medications   Medication Sig Start Date End Date Taking? Authorizing Provider  doxycycline (VIBRAMYCIN) 100 MG capsule Take 1 capsule (100 mg total) by mouth 2 (two) times daily. 05/01/21  Yes Cheron Schaumann K, PA-C  naproxen (NAPROSYN) 500 MG tablet Take 1 tablet (500 mg total) by mouth 2 (two) times daily with a meal. 10/20/20   Wallis Bamberg, PA-C  ondansetron (ZOFRAN ODT) 4 MG disintegrating tablet Take 1 tablet (4 mg total) by mouth every 8 (eight) hours as needed for nausea or vomiting. 10/26/20   Elpidio Anis, PA-C      Allergies    Bee venom and Shrimp flavor    Review of Systems   Review of Systems  Gastrointestinal:  Negative for abdominal pain and vomiting.  Skin:  Negative for color change and rash.  All other systems reviewed and are negative.  Physical Exam Updated Vital Signs BP 110/86 (BP Location: Right Arm)    Pulse 86    Temp 99.2 F (37.3 C) (Oral)    Resp 16    SpO2 100%  Physical Exam Vitals reviewed.  Constitutional:      Appearance: Normal appearance.  HENT:     Head: Normocephalic.     Mouth/Throat:     Mouth: Mucous membranes are moist.  Eyes:     Pupils: Pupils are equal, round, and reactive to light.  Cardiovascular:     Rate and Rhythm: Normal rate.  Pulmonary:     Effort: Pulmonary effort is normal.   Musculoskeletal:        General: Swelling and tenderness present.     Cervical back: Normal range of motion.     Comments: 10cm abscess left axilla  Skin:    General: Skin is warm.  Neurological:     General: No focal deficit present.     Mental Status: She is alert.  Psychiatric:        Mood and Affect: Mood normal.    ED Results / Procedures / Treatments   Labs (all labs ordered are listed, but only abnormal results are displayed) Labs Reviewed  CBC WITH DIFFERENTIAL/PLATELET - Abnormal; Notable for the following components:      Result Value   WBC 25.4 (*)    Neutro Abs 20.8 (*)    All other components within normal limits  BASIC METABOLIC PANEL - Abnormal; Notable for the following components:   Glucose, Bld 120 (*)    All other components within normal limits  LACTIC ACID, PLASMA  I-STAT BETA HCG BLOOD, ED ([facility], [facility], [facility] ONLY)    EKG None  Radiology No results found.  Procedures .Marland KitchenIncision and Drainage  Date/Time: 05/01/2021 9:38 PM Performed by: Elson Areas, PA-C Authorized by: Elson Areas, PA-C   Consent:    Consent obtained:  Verbal   Risks, benefits,  and alternatives were discussed: yes     Risks discussed:  Bleeding   Alternatives discussed:  Delayed treatment Universal protocol:    Procedure explained and questions answered to patient or proxy's satisfaction: yes     Immediately prior to procedure, a time out was called: yes     Patient identity confirmed:  Verbally with patient Location:    Type:  Abscess   Size:  10   Location:  Upper extremity   Upper extremity location:  Arm Pre-procedure details:    Skin preparation:  Chlorhexidine Sedation:    Sedation type:  None Anesthesia:    Anesthesia method:  Local infiltration   Local anesthetic:  Lidocaine 1% w/o epi Procedure type:    Complexity:  Complex Procedure details:    Needle aspiration: no     Incision types:  Single straight   Incision depth:  Submucosal   Wound management:   Probed and deloculated, irrigated with saline and extensive cleaning   Drainage:  Purulent   Drainage amount:  Copious   Packing materials:  1/4 in iodoform gauze Post-procedure details:    Procedure completion:  Tolerated    Medications Ordered in ED Medications  oxyCODONE-acetaminophen (PERCOCET/ROXICET) 5-325 MG per tablet 1 tablet (1 tablet Oral Given 05/01/21 1520)  cefTRIAXone (ROCEPHIN) 1 g in sodium chloride 0.9 % 100 mL IVPB (0 g Intravenous Stopped 05/01/21 2113)  HYDROmorphone (DILAUDID) injection 1 mg (1 mg Intravenous Given 05/01/21 2023)  ondansetron (ZOFRAN) injection 4 mg (4 mg Intravenous Given 05/01/21 2025)  lidocaine (PF) (XYLOCAINE) 1 % injection 10 mL (10 mLs Infiltration Given by Other 05/01/21 2113)    ED Course/ Medical Decision Making/ A&P                           Medical Decision Making Risk Prescription drug management.   MDM:  Pt has a large axillary abscess,  Labs reviewed pt has an elevated wbc count.  Pt given IV Rocephin 1 gram.  Pt given Dilaudid for pain  Pt given rx for doxycycline.  Pt advised to recheck at Urgent care in 2 days for packing removal         Final Clinical Impression(s) / ED Diagnoses Final diagnoses:  Abscess of axilla, left    Rx / DC Orders ED Discharge Orders          Ordered    doxycycline (VIBRAMYCIN) 100 MG capsule  2 times daily        05/01/21 2051           An After Visit Summary was printed and given to the patient.    Osie Cheeks 05/01/21 2142    Wynetta Fines, MD 05/02/21 1146

## 2021-05-01 NOTE — ED Triage Notes (Addendum)
Pt reports boil in L axilla progressive in pain and size x 4 days. Hx of same but reports this is bigger than usual. Pt tachycardic in triage. Also requesting a pregnancy test.

## 2021-05-01 NOTE — Discharge Instructions (Addendum)
Return if any problems.  Recheck at Urgent care in 2 days for packing removal

## 2021-05-01 NOTE — ED Provider Triage Note (Signed)
Emergency Medicine Provider Triage Evaluation Note  Yvonne Waller , a 26 y.o. female  was evaluated in triage.  Pt complains of left armpit boil. Pt states this has gotten larger and more painful for the past 4 days. Hx of recurrent boils requiring drainage.   Review of Systems  Positive: abscess Negative: Fever, chills  Physical Exam  BP (!) 134/93 (BP Location: Left Arm)    Pulse (!) 146    Temp 99.4 F (37.4 C) (Oral)    Resp 16    SpO2 97%  Gen:   Awake, no distress   Resp:  Normal effort  MSK:   Moves extremities without difficulty  Other:    Medical Decision Making  Medically screening exam initiated at 3:07 PM.  Appropriate orders placed.  Yvonne Waller was informed that the remainder of the evaluation will be completed by another provider, this initial triage assessment does not replace that evaluation, and the importance of remaining in the ED until their evaluation is complete.  Noted to be tachycardic in triage, will obtain basic labs   Yvonne Waller T, PA-C 05/01/21 1508

## 2021-05-04 ENCOUNTER — Ambulatory Visit (HOSPITAL_COMMUNITY)
Admission: EM | Admit: 2021-05-04 | Discharge: 2021-05-04 | Disposition: A | Discharge status: Home / Self Care | Payer: Medicaid Other | Attending: Family Medicine | Admitting: Family Medicine

## 2021-05-04 ENCOUNTER — Other Ambulatory Visit: Payer: Self-pay

## 2021-05-04 ENCOUNTER — Encounter (HOSPITAL_COMMUNITY): Payer: Self-pay | Admitting: Emergency Medicine

## 2021-05-04 DIAGNOSIS — Z3202 Encounter for pregnancy test, result negative: Secondary | ICD-10-CM

## 2021-05-04 DIAGNOSIS — L0291 Cutaneous abscess, unspecified: Secondary | ICD-10-CM

## 2021-05-04 DIAGNOSIS — L02412 Cutaneous abscess of left axilla: Secondary | ICD-10-CM

## 2021-05-04 DIAGNOSIS — L089 Local infection of the skin and subcutaneous tissue, unspecified: Secondary | ICD-10-CM

## 2021-05-04 DIAGNOSIS — Z5189 Encounter for other specified aftercare: Secondary | ICD-10-CM

## 2021-05-04 LAB — POC URINE PREG, ED: Preg Test, Ur: NEGATIVE

## 2021-05-04 MED ORDER — CEFTRIAXONE SODIUM 1 G IJ SOLR
INTRAMUSCULAR | Status: AC
  Filled 2021-05-04: qty 10

## 2021-05-04 MED ORDER — LIDOCAINE HCL (PF) 1 % IJ SOLN
INTRAMUSCULAR | Status: AC
  Filled 2021-05-04: qty 2

## 2021-05-04 MED ORDER — SULFAMETHOXAZOLE-TRIMETHOPRIM 800-160 MG PO TABS: 1.0000 | ORAL_TABLET | Freq: Two times a day (BID) | ORAL | 0 refills | Status: AC

## 2021-05-04 MED ORDER — CEFTRIAXONE SODIUM 1 G IJ SOLR
1.0000 g | Freq: Once | INTRAMUSCULAR | Status: AC
  Administered 2021-05-04: 1 g via INTRAMUSCULAR

## 2021-05-04 NOTE — Discharge Instructions (Signed)
You received an injection of ceftriaxone in the office today, this is a strong antibiotic which should make some significant progress in helping your infection heal.  Please go to [facility] today to pick up your prescription for a new antibiotic called Bactrim, also called sulfamethoxazole trimethoprim,.  It is very important that you do this today and that you take your first tablet tonight and then continue taking it twice daily until the prescription is all gone.  Is also very important that you do not skip any doses and that you take all of it exactly as prescribed.  The only way to help your infection heal is free to take all of your medicine and to take very good care of your wound.  Please continue to avoid using deodorant and shaving under your arm until your wound is completely healed, there is no more redness and there is no more pain.  In 7 days, if your left armpit does not feel completely better, please come back for reevaluation.  I provided you with a note to return to work in 3 days.  If you are still too sore to return to work, please come back to urgent care so that we can write you another note.  Thank you for visiting urgent care today.  I hope you feel better soon.

## 2021-05-04 NOTE — ED Triage Notes (Signed)
Pt seen in [facility] ED for abscess on left arm. Reports was told to follow up with UC in couple days for removal of packing. Pt denies any complications except pain.   Pt also asking for pregnancy test. Remembers having a period in December but unsure about January.

## 2021-05-04 NOTE — ED Provider Notes (Signed)
[place]    CSN: [id number] Arrival date & time: 05/04/21  1520    HISTORY   Chief Complaint  Patient presents with   Wound Check   HPI Yvonne Waller is a 26 y.o. female. Patient presents to urgent care today for reevaluation of drainage of a left axillary abscess that was packed with quarter inch iodoform performed on May 01, 2021.  Patient states that she was unable to pick up her prescription for doxycycline due to lack of many.  Patient states the wound is continued to become even more painful than before she had it drained, states she is unable to carry anything using her left hand or arm, states she is unable to sleep at night because it is so painful when she lies down.  Patient has normal vital signs on arrival today.  Patient denies fever, aches, chills.  Patient states the wound is continued to drain, states she feels this is because no bandage was placed after the wound was packed, patient states that the drainage is ruined two of her bedroom pillows.  The history is provided by the patient.  Past Medical History:  Diagnosis Date   Bipolar 1 disorder ([place])    Depression    Herpes genitalis    Hydrocephalus ([place])    Leukocytosis    Seizures ([facility])    There are no problems to display for this patient.  Past Surgical History:  Procedure Laterality Date   TONSILLECTOMY     OB History   No obstetric history on file.    Home Medications    Prior to Admission medications   Medication Sig Start Date End Date Taking? Authorizing Provider  sulfamethoxazole-trimethoprim (BACTRIM DS) 800-160 MG tablet Take 1 tablet by mouth 2 (two) times daily for 7 days. 05/04/21 05/11/21 Yes Lynden Oxford Scales, PA-C  naproxen (NAPROSYN) 500 MG tablet Take 1 tablet (500 mg total) by mouth 2 (two) times daily with a meal. 10/20/20   Jaynee Eagles, PA-C  ondansetron (ZOFRAN ODT) 4 MG disintegrating tablet Take 1 tablet (4 mg total) by mouth every 8 (eight) hours as needed  for nausea or vomiting. 10/26/20   Charlann Lange, PA-C    Family History Family History  Problem Relation Age of Onset   Healthy Mother    Healthy Father    Social History Social History   Tobacco Use   Smoking status: Every Day   Smokeless tobacco: Current  Vaping Use   Vaping Use: Never used  Substance Use Topics   Alcohol use: No   Drug use: No   Allergies   Bee venom and Shrimp flavor  Review of Systems Review of Systems Pertinent findings noted in history of present illness.   Physical Exam Triage Vital Signs ED Triage Vitals  Enc Vitals Group     BP 01/26/21 0827 (!) 147/82     Pulse Rate 01/26/21 0827 72     Resp 01/26/21 0827 18     Temp 01/26/21 0827 98.3 F (36.8 C)     Temp Source 01/26/21 0827 Oral     SpO2 01/26/21 0827 98 %     Weight --      Height --      Head Circumference --      Peak Flow --      Pain Score 01/26/21 0826 5     Pain Loc --      Pain Edu? --      Excl. in [place]? --  No data found.  Updated Vital Signs BP 113/61 (BP Location: Right Arm)    Pulse 97    Temp 99 F (37.2 C) (Oral)    Resp 17    SpO2 100%   Physical Exam Vitals and nursing note reviewed.  Constitutional:      General: She is not in acute distress.    Appearance: Normal appearance. She is not ill-appearing.  HENT:     Head: Normocephalic and atraumatic.  Eyes:     General: Lids are normal.        Right eye: No discharge.        Left eye: No discharge.     Extraocular Movements: Extraocular movements intact.     Conjunctiva/sclera: Conjunctivae normal.     Right eye: Right conjunctiva is not injected.     Left eye: Left conjunctiva is not injected.  Neck:     Trachea: Trachea and phonation normal.  Cardiovascular:     Rate and Rhythm: Normal rate and regular rhythm.     Pulses: Normal pulses.     Heart sounds: Normal heart sounds. No murmur heard.   No friction rub. No gallop.  Pulmonary:     Effort: Pulmonary effort is normal. No accessory muscle  usage, prolonged expiration or respiratory distress.     Breath sounds: Normal breath sounds. No stridor, decreased air movement or transmitted upper airway sounds. No decreased breath sounds, wheezing, rhonchi or rales.  Chest:     Chest wall: No tenderness.  Musculoskeletal:        General: Normal range of motion.     Cervical back: Normal range of motion and neck supple. Normal range of motion.  Lymphadenopathy:     Cervical: No cervical adenopathy.  Skin:    General: Skin is warm and dry.     Findings: Lesion (Left axillary stab incision draining purulent fluid, there is surrounding in erythema and induration, area is extremely tender to palpation per patient) present. No erythema or rash.  Neurological:     General: No focal deficit present.     Mental Status: She is alert and oriented to person, place, and time.  Psychiatric:        Mood and Affect: Mood normal.        Behavior: Behavior normal.    Visual Acuity Right Eye Distance:   Left Eye Distance:   Bilateral Distance:    Right Eye Near:   Left Eye Near:    Bilateral Near:     UC Couse / Diagnostics / Procedures:    EKG  Radiology No results found.  Procedures Procedures (including critical care time)  UC Diagnoses / Final Clinical Impressions(s)   I have reviewed the triage vital signs and the nursing notes.  Pertinent labs & imaging results that were available during my care of the patient were reviewed by me and considered in my medical decision making (see chart for details).    Final diagnoses:  Wound infection  Encounter for wound re-check  Abscess   Iodoform removed, was unable to express any more fluids secondary to patient discomfort.  Patient was provided with an injection of ceftriaxone and a prescription for Bactrim to begin now.  Patient was given return precautions.  Patient was advised to monitor for increased purulent drainage, bloody drainage, fever.    ED Prescriptions     Medication  Sig Dispense Auth. Provider   sulfamethoxazole-trimethoprim (BACTRIM DS) 800-160 MG tablet Take 1 tablet by mouth 2 (two)  times daily for 7 days. 14 tablet Lynden Oxford Scales, PA-C      PDMP not reviewed this encounter.  Pending results:  Labs Reviewed  POC URINE PREG, ED    Medications Ordered in UC: Medications  cefTRIAXone (ROCEPHIN) injection 1 g (has no administration in time range)    Disposition Upon Discharge:  Condition: stable for discharge home Home: take medications as prescribed; routine discharge instructions as discussed; follow up as advised.  Patient presented with an acute illness with associated systemic symptoms and significant discomfort requiring urgent management. In my opinion, this is a condition that a prudent lay person (someone who possesses an average knowledge of health and medicine) may potentially expect to result in complications if not addressed urgently such as respiratory distress, impairment of bodily function or dysfunction of bodily organs.   Routine symptom specific, illness specific and/or disease specific instructions were discussed with the patient and/or caregiver at length.   As such, the patient has been evaluated and assessed, work-up was performed and treatment was provided in alignment with urgent care protocols and evidence based medicine.  Patient/parent/caregiver has been advised that the patient may require follow up for further testing and treatment if the symptoms continue in spite of treatment, as clinically indicated and appropriate.  If the patient was tested for COVID-19, Influenza and/or RSV, then the patient/parent/guardian was advised to isolate at home pending the results of his/her diagnostic coronavirus test and potentially longer if theyre positive. I have also advised pt that if his/her COVID-19 test returns positive, it's recommended to self-isolate for at least 10 days after symptoms first appeared AND until  fever-free for 24 hours without fever reducer AND other symptoms have improved or resolved. Discussed self-isolation recommendations as well as instructions for household member/close contacts as per the [facility] and [facility], and also gave patient the [place] packet with this information.  Patient/parent/caregiver has been advised to return to the [facility] or PCP in 3-5 days if no better; to PCP or the Emergency Department if new signs and symptoms develop, or if the current signs or symptoms continue to change or worsen for further workup, evaluation and treatment as clinically indicated and appropriate  The patient will follow up with their current PCP if and as advised. If the patient does not currently have a PCP we will assist them in obtaining one.   The patient may need specialty follow up if the symptoms continue, in spite of conservative treatment and management, for further workup, evaluation, consultation and treatment as clinically indicated and appropriate.   Patient/parent/caregiver verbalized understanding and agreement of plan as discussed.  All questions were addressed during visit.  Please see discharge instructions below for further details of plan.  Discharge Instructions:   Discharge Instructions      You received an injection of ceftriaxone in the office today, this is a strong antibiotic which should make some significant progress in helping your infection heal.  Please go to [facility] today to pick up your prescription for a new antibiotic called Bactrim, also called sulfamethoxazole trimethoprim,.  It is very important that you do this today and that you take your first tablet tonight and then continue taking it twice daily until the prescription is all gone.  Is also very important that you do not skip any doses and that you take all of it exactly as prescribed.  The only way to help your infection heal is free to take all of your medicine and to take very  good care of your  wound.  Please continue to avoid using deodorant and shaving under your arm until your wound is completely healed, there is no more redness and there is no more pain.  In 7 days, if your left armpit does not feel completely better, please come back for reevaluation.  I provided you with a note to return to work in 3 days.  If you are still too sore to return to work, please come back to urgent care so that we can write you another note.  Thank you for visiting urgent care today.  I hope you feel better soon.    This office note has been dictated using Museum/gallery curator.  Unfortunately, and despite my best efforts, this method of dictation can sometimes lead to occasional typographical or grammatical errors.  I apologize in advance if this occurs.     Lynden Oxford Scales, PA-C 05/04/21 1726

## 2021-05-08 ENCOUNTER — Ambulatory Visit (HOSPITAL_COMMUNITY)
Admission: EM | Admit: 2021-05-08 | Discharge: 2021-05-08 | Disposition: A | Discharge status: Home / Self Care | Payer: Medicaid Other

## 2021-05-08 ENCOUNTER — Encounter (HOSPITAL_COMMUNITY): Payer: Self-pay

## 2021-05-08 ENCOUNTER — Other Ambulatory Visit: Payer: Self-pay

## 2021-05-08 DIAGNOSIS — Z5189 Encounter for other specified aftercare: Secondary | ICD-10-CM

## 2021-05-08 DIAGNOSIS — L02412 Cutaneous abscess of left axilla: Secondary | ICD-10-CM

## 2021-05-08 NOTE — ED Provider Notes (Signed)
[facility]    CSN: [id number] Arrival date & time: 05/08/21  1316      History   Chief Complaint Chief Complaint  Patient presents with   Abscess    HPI Princess Karnes is a 26 y.o. female presenting for wound check - abscess L axilla.  Medical history seizures, hydrocephalus.  We last saw her on 05/04/2021 for a wound check, at that time she had been unable to pick up her prescription due to cost.  At that visit, Rocephin was administered, and they sent a new prescription of Bactrim. She has been taking bactrim with significant improvement in symptoms. Requesting to go back to work tomorrow.  HPI  Past Medical History:  Diagnosis Date   Bipolar 1 disorder ([facility])    Depression    Herpes genitalis    Hydrocephalus ([facility])    Leukocytosis    Seizures ([facility])     There are no problems to display for this patient.   Past Surgical History:  Procedure Laterality Date   TONSILLECTOMY      OB History   No obstetric history on file.      Home Medications    Prior to Admission medications   Medication Sig Start Date End Date Taking? Authorizing Provider  naproxen (NAPROSYN) 500 MG tablet Take 1 tablet (500 mg total) by mouth 2 (two) times daily with a meal. 10/20/20   Wallis Bamberg, PA-C  ondansetron (ZOFRAN ODT) 4 MG disintegrating tablet Take 1 tablet (4 mg total) by mouth every 8 (eight) hours as needed for nausea or vomiting. 10/26/20   Elpidio Anis, PA-C  sulfamethoxazole-trimethoprim (BACTRIM DS) 800-160 MG tablet Take 1 tablet by mouth 2 (two) times daily for 7 days. 05/04/21 05/11/21  Theadora Rama Scales, PA-C    Family History Family History  Problem Relation Age of Onset   Healthy Mother    Healthy Father     Social History Social History   Tobacco Use   Smoking status: Every Day   Smokeless tobacco: Current  Vaping Use   Vaping Use: Never used  Substance Use Topics   Alcohol use: No   Drug use: No     Allergies   Bee venom and Shrimp  flavor   Review of Systems Review of Systems  Skin:  Positive for color change.  All other systems reviewed and are negative.   Physical Exam Triage Vital Signs ED Triage Vitals  Enc Vitals Group     BP      Pulse      Resp      Temp      Temp src      SpO2      Weight      Height      Head Circumference      Peak Flow      Pain Score      Pain Loc      Pain Edu?      Excl. in [place]?    No data found.  Updated Vital Signs BP 101/68 (BP Location: Left Arm)    Pulse 87    Temp 98.4 F (36.9 C) (Oral)    Resp 18    LMP 03/29/2021    SpO2 98%   Visual Acuity Right Eye Distance:   Left Eye Distance:   Bilateral Distance:    Right Eye Near:   Left Eye Near:    Bilateral Near:     Physical Exam Vitals reviewed.  Constitutional:  General: She is not in acute distress.    Appearance: Normal appearance. She is not ill-appearing.  HENT:     Head: Normocephalic and atraumatic.  Pulmonary:     Effort: Pulmonary effort is normal.  Skin:    Comments: See image below Wound is healing well, with no current induration, fluctuance, tenderness.   Neurological:     General: No focal deficit present.     Mental Status: She is alert and oriented to person, place, and time.  Psychiatric:        Mood and Affect: Mood normal.        Behavior: Behavior normal.        Thought Content: Thought content normal.        Judgment: Judgment normal.       UC Treatments / Results  Labs (all labs ordered are listed, but only abnormal results are displayed) Labs Reviewed - No data to display  EKG   Radiology No results found.  Procedures Procedures (including critical care time)  Medications Ordered in UC Medications - No data to display  Initial Impression / Assessment and Plan / UC Course  I have reviewed the triage vital signs and the nursing notes.  Pertinent labs & imaging results that were available during my care of the patient were reviewed by me and  considered in my medical decision making (see chart for details).     This patient is a very pleasant 26 y.o. year old female presenting for wound check - abscess L axilla. Afebrile, nontachy. Wound is healing well. Continue bactrim. Cleared to return to work tomorrow. ED return precautions discussed. Patient verbalizes understanding and agreement.  .   Final Clinical Impressions(s) / UC Diagnoses   Final diagnoses:  Abscess of left axilla  Wound check, abscess     Discharge Instructions      -Complete the antibiotics as directed -Wash with gentle soap and water -Follow-up if symptoms getting worse instead of better     ED Prescriptions   None    PDMP not reviewed this encounter.   Rhys Martini, PA-C 05/08/21 1449

## 2021-05-08 NOTE — Discharge Instructions (Addendum)
-  Complete the antibiotics as directed -Wash with gentle soap and water -Follow-up if symptoms getting worse instead of better

## 2021-05-08 NOTE — ED Triage Notes (Signed)
Pt here for abscess wound check. Pt c/o little pain but much better then when she was here on 2/3.

## 2021-06-14 ENCOUNTER — Emergency Department (HOSPITAL_COMMUNITY)
Admission: EM | Admit: 2021-06-14 | Discharge: 2021-06-14 | Disposition: A | Discharge status: Home / Self Care | Payer: Self-pay | Attending: Emergency Medicine | Admitting: Emergency Medicine

## 2021-06-14 ENCOUNTER — Encounter (HOSPITAL_COMMUNITY): Payer: Self-pay

## 2021-06-14 ENCOUNTER — Emergency Department (HOSPITAL_COMMUNITY): Payer: Self-pay

## 2021-06-14 DIAGNOSIS — D72829 Elevated white blood cell count, unspecified: Secondary | ICD-10-CM | POA: Insufficient documentation

## 2021-06-14 DIAGNOSIS — F1490 Cocaine use, unspecified, uncomplicated: Secondary | ICD-10-CM | POA: Insufficient documentation

## 2021-06-14 DIAGNOSIS — R112 Nausea with vomiting, unspecified: Secondary | ICD-10-CM | POA: Insufficient documentation

## 2021-06-14 DIAGNOSIS — R1084 Generalized abdominal pain: Secondary | ICD-10-CM | POA: Insufficient documentation

## 2021-06-14 DIAGNOSIS — R4182 Altered mental status, unspecified: Secondary | ICD-10-CM | POA: Insufficient documentation

## 2021-06-14 LAB — CBC WITH DIFFERENTIAL/PLATELET
Abs Immature Granulocytes: 0.18 10*3/uL — ABNORMAL HIGH (ref 0.00–0.07)
Basophils Absolute: 0.2 10*3/uL — ABNORMAL HIGH (ref 0.0–0.1)
Basophils Relative: 1 %
Eosinophils Absolute: 0.5 10*3/uL (ref 0.0–0.5)
Eosinophils Relative: 2 %
HCT: 48.2 % — ABNORMAL HIGH (ref 36.0–46.0)
Hemoglobin: 16.2 g/dL — ABNORMAL HIGH (ref 12.0–15.0)
Immature Granulocytes: 1 %
Lymphocytes Relative: 20 %
Lymphs Abs: 5 10*3/uL — ABNORMAL HIGH (ref 0.7–4.0)
MCH: 32.1 pg (ref 26.0–34.0)
MCHC: 33.6 g/dL (ref 30.0–36.0)
MCV: 95.6 fL (ref 80.0–100.0)
Monocytes Absolute: 2 10*3/uL — ABNORMAL HIGH (ref 0.1–1.0)
Monocytes Relative: 8 %
Neutro Abs: 17.3 10*3/uL — ABNORMAL HIGH (ref 1.7–7.7)
Neutrophils Relative %: 68 %
Platelets: 330 10*3/uL (ref 150–400)
RBC: 5.04 MIL/uL (ref 3.87–5.11)
RDW: 12.6 % (ref 11.5–15.5)
WBC: 25.1 10*3/uL — ABNORMAL HIGH (ref 4.0–10.5)
nRBC: 0 % (ref 0.0–0.2)

## 2021-06-14 LAB — LACTIC ACID, PLASMA
Lactic Acid, Venous: 4.8 mmol/L (ref 0.5–1.9)
Lactic Acid, Venous: 0.8 mmol/L (ref 0.5–1.9)

## 2021-06-14 LAB — ETHANOL: Alcohol, Ethyl (B): <10 mg/dL (ref <10)

## 2021-06-14 LAB — I-STAT BETA HCG BLOOD, ED (MC, WL, AP ONLY): I-stat hCG, quantitative: <5 m[IU]/mL (ref <5)

## 2021-06-14 LAB — COMPREHENSIVE METABOLIC PANEL
ALT: 12 U/L (ref 0–44)
AST: 24 U/L (ref 15–41)
Albumin: 4.6 g/dL (ref 3.5–5.0)
Alkaline Phosphatase: 64 U/L (ref 38–126)
Anion gap: 18 — ABNORMAL HIGH (ref 5–15)
BUN: 11 mg/dL (ref 6–20)
CO2: 17 mmol/L — ABNORMAL LOW (ref 22–32)
Calcium: 9.7 mg/dL (ref 8.9–10.3)
Chloride: 102 mmol/L (ref 98–111)
Creatinine, Ser: 1.02 mg/dL — ABNORMAL HIGH (ref 0.44–1.00)
GFR, Estimated: >60 mL/min (ref >60)
Glucose, Bld: 108 mg/dL — ABNORMAL HIGH (ref 70–99)
Potassium: 3.6 mmol/L (ref 3.5–5.1)
Sodium: 137 mmol/L (ref 135–145)
Total Bilirubin: 0.9 mg/dL (ref 0.3–1.2)
Total Protein: 7.9 g/dL (ref 6.5–8.1)

## 2021-06-14 LAB — LIPASE, BLOOD: Lipase: 22 U/L (ref 11–51)

## 2021-06-14 LAB — CK: Total CK: 63 U/L (ref 38–234)

## 2021-06-14 MED ORDER — SODIUM CHLORIDE 0.9 % IV BOLUS
1000.0000 mL | Freq: Once | INTRAVENOUS | Status: AC
  Administered 2021-06-14: 1000 mL via INTRAVENOUS

## 2021-06-14 MED ORDER — IOHEXOL 300 MG/ML  SOLN
100.0000 mL | Freq: Once | INTRAMUSCULAR | Status: AC | PRN
  Administered 2021-06-14: 100 mL via INTRAVENOUS

## 2021-06-14 MED ORDER — ONDANSETRON HCL 4 MG PO TABS: 4.0000 mg | ORAL_TABLET | Freq: Four times a day (QID) | ORAL | 0 refills | Status: DC

## 2021-06-14 MED ORDER — SODIUM CHLORIDE 0.9 % IV BOLUS: 1000.0000 mL | Freq: Once | INTRAVENOUS | Status: DC

## 2021-06-14 MED ORDER — SODIUM CHLORIDE 0.9 % IV BOLUS
1000.0000 mL | Freq: Once | INTRAVENOUS | Status: AC
Start: 2021-06-14 — End: 2021-06-14
  Administered 2021-06-14: 1000 mL via INTRAVENOUS

## 2021-06-14 MED ORDER — HALOPERIDOL LACTATE 5 MG/ML IJ SOLN
5.0000 mg | Freq: Once | INTRAMUSCULAR | Status: AC
  Administered 2021-06-14: 5 mg via INTRAVENOUS
  Filled 2021-06-14: qty 1

## 2021-06-14 MED ORDER — CEPHALEXIN 500 MG PO CAPS: 500.0000 mg | ORAL_CAPSULE | Freq: Two times a day (BID) | ORAL | 0 refills | Status: AC

## 2021-06-14 NOTE — ED Notes (Signed)
?  Patient asked to give urine specimen and refused.  Told patient that she would be able to go home after we checked her urine to make sure everything was ok and she still refused.  Dr. Ronnald Nian notified. ?

## 2021-06-14 NOTE — Discharge Instructions (Signed)
Continue Zofran as needed for nausea and vomiting.  Please refrain from any drug use including marijuana. ?

## 2021-06-14 NOTE — ED Triage Notes (Signed)
Pt arrives to ED and registration reported to Triage EMT that pt was seizing and unresponsive. Pt was immediately brought back to resus, where she was responsive to sternal rub. Pt opened her eyes, started breathing heavily, laid her head back down and went back to sleep. No evidence of shaking or seizure activity for this RN. VSS O2 100% on RA, NAD. IV access obtained, MD [place] to bedside. Pt intermittently wakes up, gasping and yelling, answering questions appropriately and then lays back down. Does report some abd pain, partner reports heavy cocaine use last night ?

## 2021-06-14 NOTE — ED Notes (Signed)
Patient transported to CT 

## 2021-06-14 NOTE — ED Provider Notes (Signed)
?[facility] ?Provider Note ? ? ?CSN: [id number] ?Arrival date & time: 06/14/21  1759 ? ?  ? ?History ? ?Chief Complaint  ?Patient presents with  ? Abdominal Pain  ? ? ?Yvonne Waller is a 26 y.o. female. ? ?Here with abdominal pain, nausea, vomiting.  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  Having intense pain throughout her abdomen.  Feels nauseous.  Not sure if she is pregnant.  Denies marijuana use.  Denies diabetes. ? ?The history is provided by the patient.  ?Abdominal Pain ?Pain location:  Generalized ?Pain quality: aching   ?Pain radiates to:  Does not radiate ?Pain severity:  Severe ?Onset quality:  Gradual ?Duration:  1 day ?Timing:  Constant ?Progression:  Unchanged ?Chronicity:  New ?Context: not eating   ?Relieved by:  Nothing ?Worsened by:  Nothing ?Associated symptoms: nausea and vomiting   ?Associated symptoms: no anorexia, no belching, no chest pain, no chills, no constipation, no cough, no shortness of breath, no sore throat, no vaginal bleeding and no vaginal discharge   ?Risk factors: not pregnant   ?Risk factors comment:  Polydubtance abuse, pseudoseizures ? ?  ? ?Home Medications ?Prior to Admission medications   ?Medication Sig Start Date End Date Taking? Authorizing Provider  ?cephALEXin (KEFLEX) 500 MG capsule Take 1 capsule (500 mg total) by mouth 2 (two) times daily for 5 days. 06/14/21 06/19/21 Yes Evangaline Jou, DO  ?ondansetron (ZOFRAN) 4 MG tablet Take 1 tablet (4 mg total) by mouth every 6 (six) hours. 06/14/21  Yes Madisun Hargrove, DO  ?naproxen (NAPROSYN) 500 MG tablet Take 1 tablet (500 mg total) by mouth 2 (two) times daily with a meal. 10/20/20   Wallis BambergMani, Mario, PA-C  ?ondansetron (ZOFRAN ODT) 4 MG disintegrating tablet Take 1 tablet (4 mg total) by mouth every 8 (eight) hours as needed for nausea or vomiting. 10/26/20   Elpidio AnisUpstill, Shari, PA-C  ?   ? ?Allergies    ?Bee venom and Shrimp flavor   ? ?Review of Systems   ?Review of Systems   ?Constitutional:  Negative for chills.  ?HENT:  Negative for sore throat.   ?Respiratory:  Negative for cough and shortness of breath.   ?Cardiovascular:  Negative for chest pain.  ?Gastrointestinal:  Positive for abdominal pain, nausea and vomiting. Negative for anorexia and constipation.  ?Genitourinary:  Negative for vaginal bleeding and vaginal discharge.  ? ?Physical Exam ?Updated Vital Signs ?BP 111/71   Pulse (!) 104   Temp 98.7 ?F (37.1 ?C) (Oral)   Resp 16   Ht 5\' 4"  (1.626 m)   Wt 57 kg   SpO2 95%   BMI 21.57 kg/m?  ?Physical Exam ?Vitals and nursing note reviewed.  ?Constitutional:   ?   General: She is in acute distress.  ?   Appearance: She is well-developed. She is ill-appearing.  ?HENT:  ?   Head: Normocephalic and atraumatic.  ?   Mouth/Throat:  ?   Mouth: Mucous membranes are moist.  ?   Pharynx: Oropharynx is clear.  ?Eyes:  ?   Extraocular Movements: Extraocular movements intact.  ?   Conjunctiva/sclera: Conjunctivae normal.  ?   Pupils: Pupils are equal, round, and reactive to light.  ?Cardiovascular:  ?   Rate and Rhythm: Normal rate and regular rhythm.  ?   Heart sounds: Normal heart sounds. No murmur heard. ?Pulmonary:  ?   Effort: Pulmonary effort is normal. No respiratory distress.  ?   Breath sounds: Normal breath sounds.  ?  Abdominal:  ?   Palpations: Abdomen is soft.  ?   Tenderness: There is no abdominal tenderness. There is guarding.  ?Musculoskeletal:     ?   General: No swelling.  ?   Cervical back: Neck supple.  ?Skin: ?   General: Skin is warm and dry.  ?   Capillary Refill: Capillary refill takes less than 2 seconds.  ?Neurological:  ?   General: No focal deficit present.  ?   Mental Status: She is alert and oriented to person, place, and time.  ?   Cranial Nerves: No cranial nerve deficit.  ?   Motor: No weakness.  ?   Comments: 5+ out of 5 strength throughout, normal sensation, no drift, normal finger-nose-finger, normal speech  ?Psychiatric:     ?   Mood and Affect: Mood  normal.  ? ? ?ED Results / Procedures / Treatments   ?Labs ?(all labs ordered are listed, but only abnormal results are displayed) ?Labs Reviewed  ?CBC WITH DIFFERENTIAL/PLATELET - Abnormal; Notable for the following components:  ?    Result Value  ? WBC 25.1 (*)   ? Hemoglobin 16.2 (*)   ? HCT 48.2 (*)   ? Neutro Abs 17.3 (*)   ? Lymphs Abs 5.0 (*)   ? Monocytes Absolute 2.0 (*)   ? Basophils Absolute 0.2 (*)   ? Abs Immature Granulocytes 0.18 (*)   ? All other components within normal limits  ?COMPREHENSIVE METABOLIC PANEL - Abnormal; Notable for the following components:  ? CO2 17 (*)   ? Glucose, Bld 108 (*)   ? Creatinine, Ser 1.02 (*)   ? Anion gap 18 (*)   ? All other components within normal limits  ?LACTIC ACID, PLASMA - Abnormal; Notable for the following components:  ? Lactic Acid, Venous 4.8 (*)   ? All other components within normal limits  ?LIPASE, BLOOD  ?ETHANOL  ?LACTIC ACID, PLASMA  ?CK  ?I-STAT BETA HCG BLOOD, ED ([facility], [facility], [facility] ONLY)  ? ? ?EKG ?EKG Interpretation ? ?Date/Time:  Thursday June 14 2021 18:14:49 EDT ?Ventricular Rate:  80 ?PR Interval:  121 ?QRS Duration: 86 ?QT Interval:  394 ?QTC Calculation: 455 ?R Axis:   81 ?Text Interpretation: Sinus rhythm Confirmed by Virgina Norfolk [id number]) on 06/14/2021 6:17:24 PM ? ?Radiology ?CT HEAD WO CONTRAST ( ) ? ?Result Date: 06/14/2021 ?CLINICAL DATA:  Mental status change due to alcohol and drug use. EXAM: CT HEAD WITHOUT CONTRAST TECHNIQUE: Contiguous axial images were obtained from the base of the skull through the vertex without intravenous contrast. RADIATION DOSE REDUCTION: This exam was performed according to the departmental dose-optimization program which includes automated exposure control, adjustment of the mA and/or kV according to patient size and/or use of iterative reconstruction technique. COMPARISON:  10/25/2020 FINDINGS: Brain: Nonstandard patient positioning limits the examination. There is moderate ventricular dilatation consistent  with hydrocephalus. This is similar to prior study. No acute mass effect or midline shift. No abnormal extra-axial fluid collections. Gray-white matter junctions are distinct. Basal cisterns are not effaced. No acute intracranial hemorrhage. Vascular: No hyperdense vessel or unexpected calcification. Skull: Normal. Negative for fracture or focal lesion. Sinuses/Orbits: Mucosal thickening throughout the paranasal sinuses. No acute air-fluid levels. Mastoid air cells are clear. Other: None. IMPRESSION: 1. Moderate hydrocephalus similar to prior study. 2. No acute intracranial abnormalities. 3. Chronic appearing inflammatory changes in the paranasal sinuses. Electronically Signed   By: Burman Nieves M.D.   On: 06/14/2021 20:30  ? ?CT ABDOMEN PELVIS  W CONTRAST ? ?Result Date: 06/14/2021 ?CLINICAL DATA:  Acute abdominal pain, nonlocalized. Altered mental status. Query drug use. EXAM: CT ABDOMEN AND PELVIS WITH CONTRAST TECHNIQUE: Multidetector CT imaging of the abdomen and pelvis was performed using the standard protocol following bolus administration of intravenous contrast. RADIATION DOSE REDUCTION: This exam was performed according to the departmental dose-optimization program which includes automated exposure control, adjustment of the mA and/or kV according to patient size and/or use of iterative reconstruction technique. CONTRAST:  OMNIPAQUE IOHEXOL 300 MG/ML  SOLN COMPARISON:  10/25/2020 FINDINGS: Lower chest: The lung bases are clear. Hepatobiliary: No focal liver abnormality is seen. No gallstones, gallbladder wall thickening, or biliary dilatation. Pancreas: Unremarkable. No pancreatic ductal dilatation or surrounding inflammatory changes. Spleen: Normal in size without focal abnormality. Adrenals/Urinary Tract: Adrenal glands are unremarkable. Kidneys are normal, without renal calculi, focal lesion, or hydronephrosis. Bladder wall is diffusely thickened, possibly indicating cystitis. Stomach/Bowel:  Stomach, small bowel, and colon are not abnormally distended. Scattered gas and stool in the colon. No wall thickening or inflammatory changes appreciated. Appendix is not identified. Vascular/Lymphatic: No significan

## 2021-06-14 NOTE — ED Notes (Addendum)
Registration came and advised this EMT that someone was having a a seizure at the check in desk.  ? ?Pt was found unresp sitting upright in wheelchair. Family advised pt had hx of sz and came to the hosp about once a year for same. Pt had jerking movement at the check in deck once although only lasted 1-2 secs. Pt was brought back to triage where Triage RN attempted to arouse pt. Family then advised pt had heavy cocaine use lastnight. Charge RN advised triage staff to take pt to recus room.  ?

## 2021-06-14 NOTE — ED Notes (Signed)
RN reviewed discharge instruction w/ pt. Follow up and prescriptions reviewed, pt had no further questions ?

## 2022-02-11 ENCOUNTER — Ambulatory Visit (HOSPITAL_COMMUNITY)
Admission: EM | Admit: 2022-02-11 | Discharge: 2022-02-11 | Disposition: A | Discharge status: Home / Self Care | Payer: Self-pay | Attending: Family Medicine | Admitting: Family Medicine

## 2022-02-11 ENCOUNTER — Emergency Department (HOSPITAL_COMMUNITY)
Admission: EM | Admit: 2022-02-11 | Discharge: 2022-02-11 | Disposition: A | Discharge status: Still In Hospital | Payer: Medicaid Other

## 2022-02-11 ENCOUNTER — Encounter (HOSPITAL_COMMUNITY): Payer: Self-pay | Admitting: Emergency Medicine

## 2022-02-11 ENCOUNTER — Ambulatory Visit (INDEPENDENT_AMBULATORY_CARE_PROVIDER_SITE_OTHER): Payer: Self-pay

## 2022-02-11 DIAGNOSIS — M25511 Pain in right shoulder: Secondary | ICD-10-CM

## 2022-02-11 DIAGNOSIS — W06XXXA Fall from bed, initial encounter: Secondary | ICD-10-CM

## 2022-02-11 MED ORDER — IBUPROFEN 800 MG PO TABS: 800.0000 mg | ORAL_TABLET | Freq: Three times a day (TID) | ORAL | 0 refills | Status: DC | PRN

## 2022-02-11 MED ORDER — KETOROLAC TROMETHAMINE 30 MG/ML IJ SOLN
INTRAMUSCULAR | Status: AC
  Filled 2022-02-11: qty 1

## 2022-02-11 MED ORDER — KETOROLAC TROMETHAMINE 30 MG/ML IJ SOLN
30.0000 mg | Freq: Once | INTRAMUSCULAR | Status: AC
  Administered 2022-02-11: 30 mg via INTRAMUSCULAR

## 2022-02-11 NOTE — Discharge Instructions (Signed)
You have been given a shot of Toradol 30 mg today.   Take ibuprofen 800 mg--1 tab every 8 hours as needed for pain.    

## 2022-02-11 NOTE — ED Provider Notes (Signed)
[facility]    CSN: [id number] Arrival date & time: 02/11/22  1908      History   Chief Complaint Chief Complaint  Patient presents with   Shoulder Pain    HPI Yvonne Waller is a 26 y.o. female.    Shoulder Pain  Here for right shoulder pain.  On November 9 she was plan around horse playing with her friend in the the patient fell onto the ground and the friend landed on top of the patient.  Her right anterior shoulder over the clavicle has been hurting since.  Last menstrual cycle was last week  Past Medical History:  Diagnosis Date   Bipolar 1 disorder (HCC)    Depression    Herpes genitalis    Hydrocephalus (HCC)    Leukocytosis    Seizures (HCC)     There are no problems to display for this patient.   Past Surgical History:  Procedure Laterality Date   TONSILLECTOMY      OB History   No obstetric history on file.      Home Medications    Prior to Admission medications   Medication Sig Start Date End Date Taking? Authorizing Provider  ibuprofen (ADVIL) 800 MG tablet Take 1 tablet (800 mg total) by mouth every 8 (eight) hours as needed (pain). 02/11/22  Yes Fendi Meinhardt, Janace Aris, MD    Family History Family History  Problem Relation Age of Onset   Healthy Mother    Healthy Father     Social History Social History   Tobacco Use   Smoking status: Every Day   Smokeless tobacco: Current  Vaping Use   Vaping Use: Never used  Substance Use Topics   Alcohol use: No   Drug use: No     Allergies   Bee venom and Shrimp flavor   Review of Systems Review of Systems   Physical Exam Triage Vital Signs ED Triage Vitals  Enc Vitals Group     BP 02/11/22 2001 119/66     Pulse Rate 02/11/22 2001 97     Resp 02/11/22 2001 19     Temp 02/11/22 2001 98.7 F (37.1 C)     Temp src --      SpO2 02/11/22 2001 98 %     Weight --      Height --      Head Circumference --      Peak Flow --      Pain Score 02/11/22 2000 10     Pain  Loc --      Pain Edu? --      Excl. in GC? --    No data found.  Updated Vital Signs BP 119/66 (BP Location: Left Arm)   Pulse 97   Temp 98.7 F (37.1 C)   Resp 19   SpO2 98%   Visual Acuity Right Eye Distance:   Left Eye Distance:   Bilateral Distance:    Right Eye Near:   Left Eye Near:    Bilateral Near:     Physical Exam Vitals reviewed.  Constitutional:      General: She is not in acute distress.    Appearance: She is not ill-appearing, toxic-appearing or diaphoretic.  Cardiovascular:     Rate and Rhythm: Normal rate and regular rhythm.  Musculoskeletal:     Comments: There is tenderness over the midshaft of the clavicle.  Range of motion of the right shoulder causes pain  Neurological:     Mental  Status: She is alert and oriented to person, place, and time.      UC Treatments / Results  Labs (all labs ordered are listed, but only abnormal results are displayed) Labs Reviewed - No data to display  EKG   Radiology DG Clavicle Right  Result Date: 02/11/2022 CLINICAL DATA:  Right shoulder pain over clavicle. Larey Seat out of bed and friend landed on top of patient. EXAM: RIGHT CLAVICLE - 2+ VIEWS COMPARISON:  None Available. FINDINGS: The acromioclavicular and glenohumeral joints are appropriately aligned. No acute fracture is seen. No dislocation. The visualized portion of the upper lungs is unremarkable. IMPRESSION: No acute fracture. Electronically Signed   By: Neita Garnet M.D.   On: 02/11/2022 20:35    Procedures Procedures (including critical care time)  Medications Ordered in UC Medications  ketorolac (TORADOL) 30 MG/ML injection 30 mg (has no administration in time range)    Initial Impression / Assessment and Plan / UC Course  I have reviewed the triage vital signs and the nursing notes.  Pertinent labs & imaging results that were available during my care of the patient were reviewed by me and considered in my medical decision making (see chart  for details).        X-ray is read as normal.  While in the x-ray room, the patient told the x-ray tech she was short of breath.  When I asked her about this, she is talking about having pain which makes it difficult to breathe some when she is coughing or breathing deeply.  Pain relief is provided and work note is provided Final Clinical Impressions(s) / UC Diagnoses   Final diagnoses:  Acute pain of right shoulder     Discharge Instructions      You have been given a shot of Toradol 30 mg today.  Take ibuprofen 800 mg--1 tab every 8 hours as needed for pain.       ED Prescriptions     Medication Sig Dispense Auth. Provider   ibuprofen (ADVIL) 800 MG tablet Take 1 tablet (800 mg total) by mouth every 8 (eight) hours as needed (pain). 21 tablet Saylee Sherrill, Janace Aris, MD      PDMP not reviewed this encounter.   Zenia Resides, MD 02/11/22 [id number]

## 2022-02-11 NOTE — ED Triage Notes (Signed)
Pt reports was playing around and right shoulder started hurting causing to be out of work since Friday. Reports fell out of bed and friend landed on top of patient.

## 2022-08-04 DIAGNOSIS — R569 Unspecified convulsions: Secondary | ICD-10-CM | POA: Diagnosis not present

## 2022-08-05 ENCOUNTER — Emergency Department (HOSPITAL_COMMUNITY)
Admission: EM | Admit: 2022-08-05 | Discharge: 2022-08-05 | Disposition: A | Discharge status: Home / Self Care | Payer: 59 | Attending: Emergency Medicine | Admitting: Emergency Medicine

## 2022-08-05 ENCOUNTER — Emergency Department (HOSPITAL_COMMUNITY): Payer: 59

## 2022-08-05 ENCOUNTER — Encounter (HOSPITAL_COMMUNITY): Payer: Self-pay | Admitting: Emergency Medicine

## 2022-08-05 DIAGNOSIS — R569 Unspecified convulsions: Secondary | ICD-10-CM | POA: Diagnosis not present

## 2022-08-05 DIAGNOSIS — R4182 Altered mental status, unspecified: Secondary | ICD-10-CM | POA: Diagnosis not present

## 2022-08-05 DIAGNOSIS — R9431 Abnormal electrocardiogram [ECG] [EKG]: Secondary | ICD-10-CM | POA: Diagnosis not present

## 2022-08-05 DIAGNOSIS — R17 Unspecified jaundice: Secondary | ICD-10-CM

## 2022-08-05 LAB — RAPID URINE DRUG SCREEN, HOSP PERFORMED
Amphetamines: NOT DETECTED
Barbiturates: NOT DETECTED
Benzodiazepines: POSITIVE — AB
Cocaine: POSITIVE — AB
Opiates: NOT DETECTED
Tetrahydrocannabinol: POSITIVE — AB

## 2022-08-05 LAB — COMPREHENSIVE METABOLIC PANEL
ALT: 9 U/L (ref 0–44)
AST: 20 U/L (ref 15–41)
Albumin: 3.9 g/dL (ref 3.5–5.0)
Alkaline Phosphatase: 59 U/L (ref 38–126)
Anion gap: 10 (ref 5–15)
BUN: 12 mg/dL (ref 6–20)
CO2: 28 mmol/L (ref 22–32)
Calcium: 9.6 mg/dL (ref 8.9–10.3)
Chloride: 101 mmol/L (ref 98–111)
Creatinine, Ser: 0.71 mg/dL (ref 0.44–1.00)
GFR, Estimated: >60 mL/min (ref >60)
Glucose, Bld: 91 mg/dL (ref 70–99)
Potassium: 4.2 mmol/L (ref 3.5–5.1)
Sodium: 139 mmol/L (ref 135–145)
Total Bilirubin: 1.3 mg/dL — ABNORMAL HIGH (ref 0.3–1.2)
Total Protein: 7.2 g/dL (ref 6.5–8.1)

## 2022-08-05 LAB — CBC WITH DIFFERENTIAL/PLATELET
Abs Immature Granulocytes: 0.24 10*3/uL — ABNORMAL HIGH (ref 0.00–0.07)
Basophils Absolute: 0.1 10*3/uL (ref 0.0–0.1)
Basophils Relative: 0 %
Eosinophils Absolute: 0.3 10*3/uL (ref 0.0–0.5)
Eosinophils Relative: 1 %
HCT: 45.6 % (ref 36.0–46.0)
Hemoglobin: 15.1 g/dL — ABNORMAL HIGH (ref 12.0–15.0)
Immature Granulocytes: 1 %
Lymphocytes Relative: 12 %
Lymphs Abs: 2.7 10*3/uL (ref 0.7–4.0)
MCH: 31.2 pg (ref 26.0–34.0)
MCHC: 33.1 g/dL (ref 30.0–36.0)
MCV: 94.2 fL (ref 80.0–100.0)
Monocytes Absolute: 1.5 10*3/uL — ABNORMAL HIGH (ref 0.1–1.0)
Monocytes Relative: 7 %
Neutro Abs: 17.1 10*3/uL — ABNORMAL HIGH (ref 1.7–7.7)
Neutrophils Relative %: 79 %
Platelets: 345 10*3/uL (ref 150–400)
RBC: 4.84 MIL/uL (ref 3.87–5.11)
RDW: 12.7 % (ref 11.5–15.5)
WBC: 22 10*3/uL — ABNORMAL HIGH (ref 4.0–10.5)
nRBC: 0 % (ref 0.0–0.2)

## 2022-08-05 LAB — CK: Total CK: 88 U/L (ref 38–234)

## 2022-08-05 LAB — ETHANOL: Alcohol, Ethyl (B): <10 mg/dL (ref <10)

## 2022-08-05 LAB — LACTIC ACID, PLASMA: Lactic Acid, Venous: 1.2 mmol/L (ref 0.5–1.9)

## 2022-08-05 MED ORDER — PANTOPRAZOLE SODIUM 40 MG PO TBEC: 40.0000 mg | DELAYED_RELEASE_TABLET | Freq: Every day | ORAL | 0 refills | Status: AC

## 2022-08-05 MED ORDER — PANTOPRAZOLE SODIUM 40 MG PO TBEC
40.0000 mg | DELAYED_RELEASE_TABLET | Freq: Once | ORAL | Status: AC
  Administered 2022-08-05: 40 mg via ORAL
  Filled 2022-08-05: qty 1

## 2022-08-05 NOTE — ED Notes (Signed)
Pt ambulated to restroom. 

## 2022-08-05 NOTE — ED Notes (Signed)
Pt refusing to be in and out cathed. Provider notified.

## 2022-08-05 NOTE — ED Triage Notes (Signed)
Pt here from a VAn with possible seizure like activity off and on this evening for about 30 mins , ems ave 5mg  versed IM prior to arrival

## 2022-08-05 NOTE — ED Provider Notes (Signed)
[place] EMERGENCY DEPARTMENT AT [facility] Provider Note   CSN: [id number] Arrival date & time: 08/05/22  0053     History  Chief Complaint  Patient presents with   Seizures    Yvonne Waller is a 27 y.o. female.  The history is provided by the EMS personnel. The history is limited by the condition of the patient (Altered mental status).  Seizures She has history of bipolar disorder, hydrocephalus, seizures and was brought in by ambulance after reported to have been having seizure activity off and on for about 30 minutes.  EMS gave midazolam 5 mg intramuscularly.  Patient is nonverbal during my exam.   Home Medications Prior to Admission medications   Medication Sig Start Date End Date Taking? Authorizing Provider  ibuprofen (ADVIL) 800 MG tablet Take 1 tablet (800 mg total) by mouth every 8 (eight) hours as needed (pain). 02/11/22   Zenia Resides, MD      Allergies    Bee venom and Shrimp flavor    Review of Systems   Review of Systems  Unable to perform ROS: Mental status change  Neurological:  Positive for seizures.    Physical Exam Updated Vital Signs BP 96/69   Pulse 99   Temp 97.8 F (36.6 C) (Axillary)   Resp 14   SpO2 97%  Physical Exam Vitals and nursing note reviewed.   27 year old female, resting comfortably and in no acute distress. Vital signs are normal. Oxygen saturation is 97%, which is normal.  She is maintaining herself in a fetal position but does show purposeful movement of all 4 extremities. Head is normocephalic and atraumatic.  She resists opening her eyes, cannot assess pupils are EOMs. Neck is nontender and supple without adenopathy or JVD. Back is nontender and there is no CVA tenderness. Lungs are clear without rales, wheezes, or rhonchi. Chest is nontender. Heart has regular rate and rhythm without murmur. Abdomen is soft, flat, nontender. Extremities have no deformity. Skin is warm and dry without  rash. Neurologic: Nonverbal but does respond to noxious stimuli with purposeful movement of all 4 extremities.  ED Results / Procedures / Treatments   Labs (all labs ordered are listed, but only abnormal results are displayed) Labs Reviewed - No data to display  EKG EKG Interpretation  Date/Time:  Monday Aug 05 2022 00:59:24 EDT Ventricular Rate:  90 PR Interval:  127 QRS Duration: 85 QT Interval:  365 QTC Calculation: 447 R Axis:   83 Text Interpretation: Sinus rhythm Normal ECG When compared with ECG of 06/14/2021, No significant change was found Confirmed by Dione Booze ([id number]) on 08/05/2022 1:03:37 AM  Radiology No results found.  Procedures Procedures  Cardiac monitor shows normal sinus rhythm, per my interpretation.  Medications Ordered in ED Medications  pantoprazole (PROTONIX) EC tablet 40 mg (has no administration in time range)    ED Course/ Medical Decision Making/ A&P                             Medical Decision Making Amount and/or Complexity of Data Reviewed Labs: ordered. Radiology: ordered.  Risk Prescription drug management.   Report of seizure-like activity.  I have reviewed her past records, and that she was seen in the ED on 10/25/2020 for seizure-like activity at which time ED workup was unremarkable and she actually ended up being treated for pelvic inflammatory disease.  She did have a CT of the head on  06/14/2021 which showed moderate hydrocephalus unchanged from prior.  Since it has been 1 year since her last CT scan, I will repeat it to make sure there is no progression of hydrocephalus.  I have also ordered routine laboratory testing of CBC and comprehensive metabolic panel and ethanol level as well as urine drug screen.  In addition, I have ordered CK and lactic acid level to see if there is evidence of actual seizure activity.  CT of head shows stable mild hydrocephalus and is unchanged from prior CT scans.  Having apparently viewed the images,  and agree with radiologist interpretation.  I have reviewed and interpreted her laboratory test, my interpretation is borderline elevation of total bilirubin which is not felt to be clinically significant, normal CK and lactic acid making recent seizure unlikely, undetectable ethanol level, moderate leukocytosis which has been present on multiple prior CBCs.  I have reevaluated the patient and she is now awake and conversant.  She states that her stomach is hurting because he has a history of stomach ulcers and I have ordered a dose of pantoprazole and I will give her a prescription for pantoprazole.  I have ordered a trial of ambulation.  If she is able to ambulate, I will discharge her with a prescription for pantoprazole and referral to neurology for follow-up.  Of note, at prior ED visit for seizure-like activity she was also referred to neurology and failed to follow-up.  Final Clinical Impression(s) / ED Diagnoses Final diagnoses:  Seizure-like activity (HCC)  Serum total bilirubin elevated    Rx / DC Orders ED Discharge Orders          Ordered    pantoprazole (PROTONIX) 40 MG tablet  Daily        08/05/22 0558              Dione Booze, MD 08/05/22 [phone]

## 2022-08-06 ENCOUNTER — Other Ambulatory Visit (HOSPITAL_COMMUNITY): Payer: Self-pay

## 2022-08-06 ENCOUNTER — Other Ambulatory Visit (HOSPITAL_COMMUNITY): Payer: Self-pay | Admitting: Emergency Medicine

## 2022-08-07 ENCOUNTER — Telehealth (HOSPITAL_COMMUNITY): Payer: Self-pay | Admitting: Emergency Medicine

## 2022-08-27 NOTE — Telephone Encounter (Signed)
Pantoprazole prescription not filled.

## 2022-08-30 ENCOUNTER — Other Ambulatory Visit (HOSPITAL_COMMUNITY): Payer: Self-pay

## 2022-09-02 ENCOUNTER — Encounter (HOSPITAL_COMMUNITY): Payer: Self-pay | Admitting: *Deleted

## 2022-09-02 ENCOUNTER — Ambulatory Visit (HOSPITAL_COMMUNITY)
Admission: EM | Admit: 2022-09-02 | Discharge: 2022-09-02 | Disposition: A | Discharge status: Home / Self Care | Payer: 59 | Attending: Internal Medicine | Admitting: Internal Medicine

## 2022-09-02 ENCOUNTER — Other Ambulatory Visit: Payer: Self-pay

## 2022-09-02 DIAGNOSIS — S61213A Laceration without foreign body of left middle finger without damage to nail, initial encounter: Secondary | ICD-10-CM

## 2022-09-02 DIAGNOSIS — Z203 Contact with and (suspected) exposure to rabies: Secondary | ICD-10-CM | POA: Diagnosis not present

## 2022-09-02 MED ORDER — TETANUS-DIPHTH-ACELL PERTUSSIS 5-2.5-18.5 LF-MCG/0.5 IM SUSY
0.5000 mL | PREFILLED_SYRINGE | Freq: Once | INTRAMUSCULAR | Status: AC
  Administered 2022-09-02: 0.5 mL via INTRAMUSCULAR

## 2022-09-02 MED ORDER — TETANUS-DIPHTH-ACELL PERTUSSIS 5-2.5-18.5 LF-MCG/0.5 IM SUSY
PREFILLED_SYRINGE | INTRAMUSCULAR | Status: AC
  Filled 2022-09-02: qty 0.5

## 2022-09-02 NOTE — Discharge Instructions (Signed)
Wound care: Please keep the area surrounding the wound/sutures clean and dry for the next 24 hours. After 24 hours, you may get the wound wet. Gently clean wound with antibacterial soap. Do not scrub wound. Cover the area with a nonstick bandage and change the bandage 2 times a day.   You should have the sutures removed in 10 days by your primary care provider or at urgent care. Return sooner than 10 days if you experience discharge from your laceration, redness around your laceration, warmth around your laceration, or fever.   You may take 600mg ibuprofen and/or tylenol 1,000mg every 6 hours as needed for aches/pains to your laceration once the numbing wears off.   Thanks for letting me fix your cut today! Feel better! 

## 2022-09-02 NOTE — ED Provider Notes (Signed)
[facility]    CSN: [id number] Arrival date & time: 09/02/22  1631      History   Chief Complaint Chief Complaint  Patient presents with   Laceration    HPI Yvonne Waller is a 27 y.o. female.   Patient presents to urgent care for evaluation of laceration to the pad of the left middle finger in between the third MCP and PIP joint that happened a few hours ago.  She states she cut her finger on a sheet of metal.  Bleeding stopped quickly with pressure.  Laceration is approximately 0.5 cm in length, however laceration is deep.  She does not take any blood thinners and has full range of motion of the affected digit.  No numbness or tingling distally to injury.  No damage to the nail of the affected digit.  Unsure of date of last tetanus injection.  Requesting updated tetanus shot in clinic.     Past Medical History:  Diagnosis Date   Bipolar 1 disorder (HCC)    Depression    Herpes genitalis    Hydrocephalus (HCC)    Leukocytosis    Seizures (HCC)     There are no problems to display for this patient.   Past Surgical History:  Procedure Laterality Date   TONSILLECTOMY      OB History   No obstetric history on file.      Home Medications    Prior to Admission medications   Medication Sig Start Date End Date Taking? Authorizing Provider  pantoprazole (PROTONIX) 40 MG tablet Take 1 tablet (40 mg total) by mouth daily. 08/05/22   Dione Booze, MD    Family History Family History  Problem Relation Age of Onset   Healthy Mother    Healthy Father     Social History Social History   Tobacco Use   Smoking status: Every Day   Smokeless tobacco: Current  Vaping Use   Vaping Use: Never used  Substance Use Topics   Alcohol use: No   Drug use: No     Allergies   Bee venom and Shrimp flavor   Review of Systems Review of Systems Per HPI  Physical Exam Triage Vital Signs ED Triage Vitals  Enc Vitals Group     BP 09/02/22 1745 106/70      Pulse Rate 09/02/22 1745 84     Resp 09/02/22 1745 18     Temp 09/02/22 1745 98.9 F (37.2 C)     Temp src --      SpO2 09/02/22 1745 95 %     Weight --      Height --      Head Circumference --      Peak Flow --      Pain Score 09/02/22 1742 10     Pain Loc --      Pain Edu? --      Excl. in GC? --    No data found.  Updated Vital Signs BP 106/70   Pulse 84   Temp 98.9 F (37.2 C)   Resp 18   LMP 08/07/2022 (Approximate)   SpO2 95%   Visual Acuity Right Eye Distance:   Left Eye Distance:   Bilateral Distance:    Right Eye Near:   Left Eye Near:    Bilateral Near:     Physical Exam Vitals and nursing note reviewed.  Constitutional:      Appearance: She is not ill-appearing or toxic-appearing.  HENT:  Head: Normocephalic and atraumatic.     Right Ear: Hearing and external ear normal.     Left Ear: Hearing and external ear normal.     Nose: Nose normal.     Mouth/Throat:     Lips: Pink.  Eyes:     General: Lids are normal. Vision grossly intact. Gaze aligned appropriately.     Extraocular Movements: Extraocular movements intact.     Conjunctiva/sclera: Conjunctivae normal.  Pulmonary:     Effort: Pulmonary effort is normal.  Musculoskeletal:       Hands:     Cervical back: Neck supple.     Comments: 0.5 cm laceration to the pad of the third left finger in between the MCP and th PIP joint as seen in image below.  Full range of motion to the left hand.  Strength and sensation intact distally to injury.  +2 left radial pulse.  Less than 3 capillary refill present to affected digit.  Laceration is not bleeding currently.  Skin:    General: Skin is warm and dry.     Capillary Refill: Capillary refill takes less than 2 seconds.     Findings: No rash.  Neurological:     General: No focal deficit present.     Mental Status: She is alert and oriented to person, place, and time. Mental status is at baseline.     Cranial Nerves: No dysarthria or facial  asymmetry.  Psychiatric:        Mood and Affect: Mood normal.        Speech: Speech normal.        Behavior: Behavior normal.        Thought Content: Thought content normal.        Judgment: Judgment normal.      UC Treatments / Results  Labs (all labs ordered are listed, but only abnormal results are displayed) Labs Reviewed - No data to display  EKG   Radiology No results found.  Procedures Laceration Repair  Date/Time: 09/02/2022 6:42 PM  Performed by: Carlisle Beers, FNP Authorized by: Carlisle Beers, FNP   Consent:    Consent obtained:  Verbal   Consent given by:  Patient   Risks, benefits, and alternatives were discussed: yes     Risks discussed:  Infection, need for additional repair, nerve damage, poor wound healing, poor cosmetic result, pain, retained foreign body, vascular damage and tendon damage   Alternatives discussed:  No treatment Universal protocol:    Patient identity confirmed:  Verbally with patient Anesthesia:    Anesthesia method:  Local infiltration   Local anesthetic:  Lidocaine 2% w/o epi Laceration details:    Location:  Finger   Finger location:  L long finger   Length (cm):  1   Depth (mm):  5 Treatment:    Area cleansed with:  Povidone-iodine   Amount of cleaning:  Standard Skin repair:    Repair method:  Sutures   Suture size:  5-0   Suture material:  Nylon   Suture technique:  Simple interrupted   Number of sutures:  2 Approximation:    Approximation:  Close Repair type:    Repair type:  Simple Post-procedure details:    Dressing:  Non-adherent dressing   Procedure completion:  Tolerated well, no immediate complications  (including critical care time)  Medications Ordered in UC Medications  Tdap (BOOSTRIX) injection 0.5 mL (0.5 mLs Intramuscular Given 09/02/22 1751)    Initial Impression / Assessment and Plan /  UC Course  I have reviewed the triage vital signs and the nursing notes.  Pertinent labs &  imaging results that were available during my care of the patient were reviewed by me and considered in my medical decision making (see chart for details).   1. Laceration of the left middle finger without foreign body without damage to nail Laceration to the left middle finger repaired. See procedure note above for details. Wound cleansed and dressed with nonstick dressing in clinic. Patient instructed to keep wound dry for 24 hours, then they may clean it with antibacterial soap and water gently. Advised not to scrub or rub site to avoid causing the sutures to separate. Dressing changes 2 times daily with nonstick dressing. No ointments, lotions, or powders to the site until site heals. Suture removal in 10 days. Advised to monitor site for signs of infection (redness, swelling, pus, pain) and return to UC sooner than suture removal if infected. Tylenol may be used every 6 hours as needed for pain once numbing wears off. Advised to rest hand and avoid exposure to potentially infectious environment. Encouraged to avoid activities that increase tension to the wound/sutures. Tdap updated.  Discussed physical exam and available lab work findings in clinic with patient.  Counseled patient regarding appropriate use of medications and potential side effects for all medications recommended or prescribed today. Discussed red flag signs and symptoms of worsening condition,when to call the PCP office, return to urgent care, and when to seek higher level of care in the emergency department. Patient verbalizes understanding and agreement with plan. All questions answered. Patient discharged in stable condition.    Final Clinical Impressions(s) / UC Diagnoses   Final diagnoses:  Laceration of left middle finger without foreign body without damage to nail, initial encounter     Discharge Instructions      Wound care: Please keep the area surrounding the wound/sutures clean and dry for the next 24 hours. After  24 hours, you may get the wound wet. Gently clean wound with antibacterial soap. Do not scrub wound. Cover the area with a nonstick bandage and change the bandage 2 times a day.   You should have the sutures removed in 10 days by your primary care provider or at urgent care. Return sooner than 10 days if you experience discharge from your laceration, redness around your laceration, warmth around your laceration, or fever.   You may take 600mg  ibuprofen and/or tylenol 1,000mg  every 6 hours as needed for aches/pains to your laceration once the numbing wears off.   Thanks for letting me fix your cut today! Feel better!     ED Prescriptions   None    PDMP not reviewed this encounter.   Carlisle Beers, [place] 09/02/22 1843

## 2022-09-02 NOTE — ED Triage Notes (Signed)
Pt presents today with lac on Lt middle finger. Pt reports cutting her ginger on metal today. Pt confirms she needs a tetanus vaccine.

## 2022-09-14 ENCOUNTER — Ambulatory Visit (HOSPITAL_COMMUNITY): Payer: 59

## 2023-02-11 IMAGING — CT CT HEAD W/O CM
5 series · 16 of 37 positions shown, 17 images · non-contrast
Comparison: None.

CLINICAL DATA: Mental status change, unknown cause

EXAM:
CT HEAD WITHOUT CONTRAST
TECHNIQUE: Contiguous axial images were obtained from the base of the skull
through the vertex without intravenous contrast.

[Series 3: head wo · axial · 0.57mm/px · z∈[-210,-90]mm · 4 of 40 slices shown, 5 images (1 of 3)]
[im 8/40  brain]
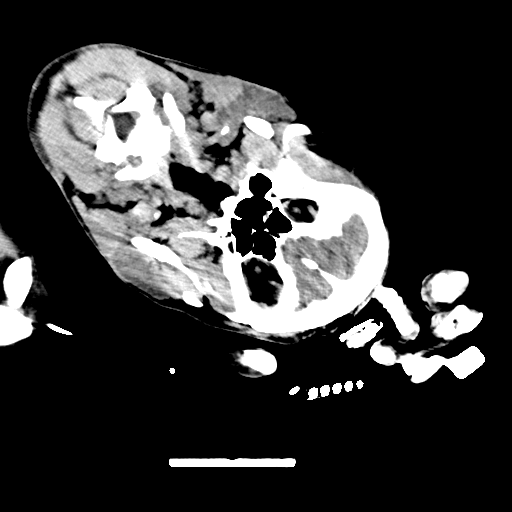
[im 8/40  bone]
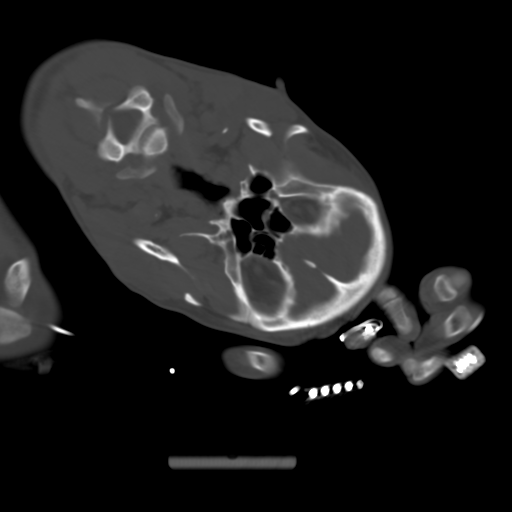
[im 16/40  brain]
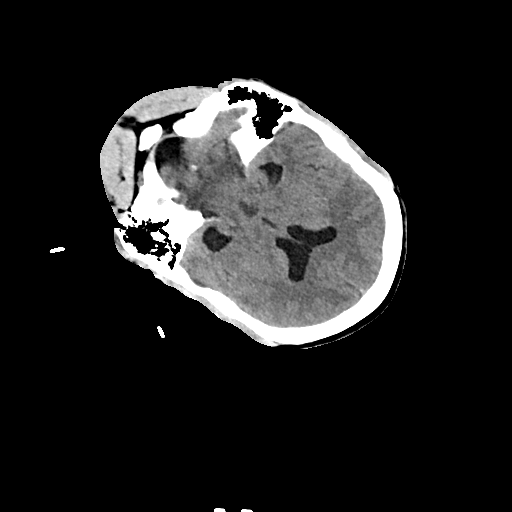
[im 24/40  brain]
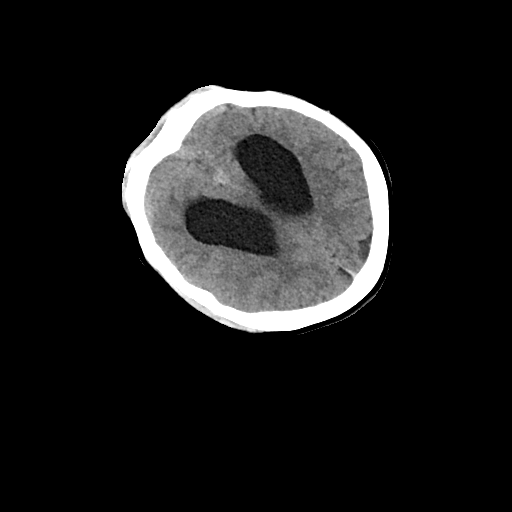
[im 32/40  brain]
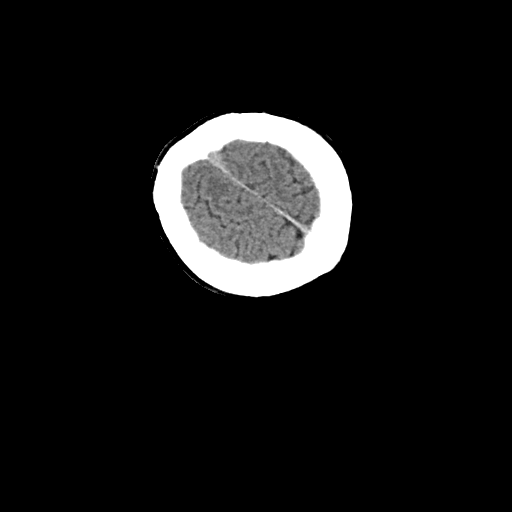

[Series 4: head bone · axial · 0.57mm/px · z∈[-232,-188]mm · 3 of 98 slices shown]
[im 8/98  bone]
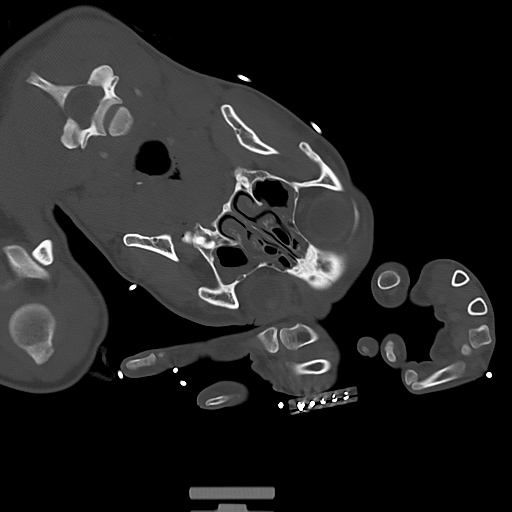
[im 23/98  bone]
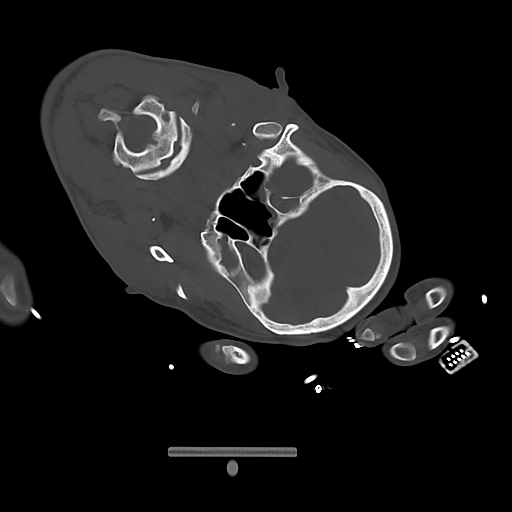
[im 30/98  bone]
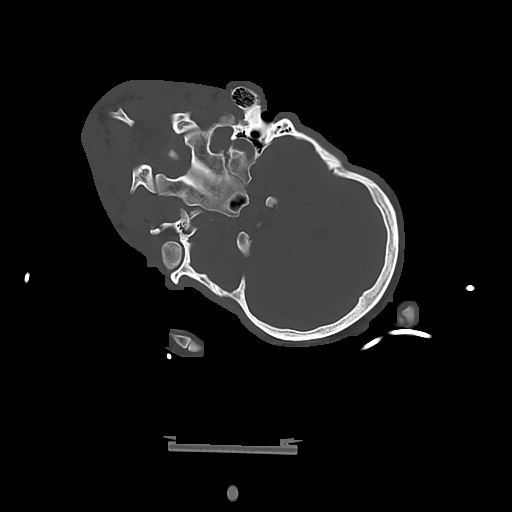

[Series 6: sag soft · sagittal · 0.38mm/px · 3 of 66 slices shown]
[im 22/66  brain]
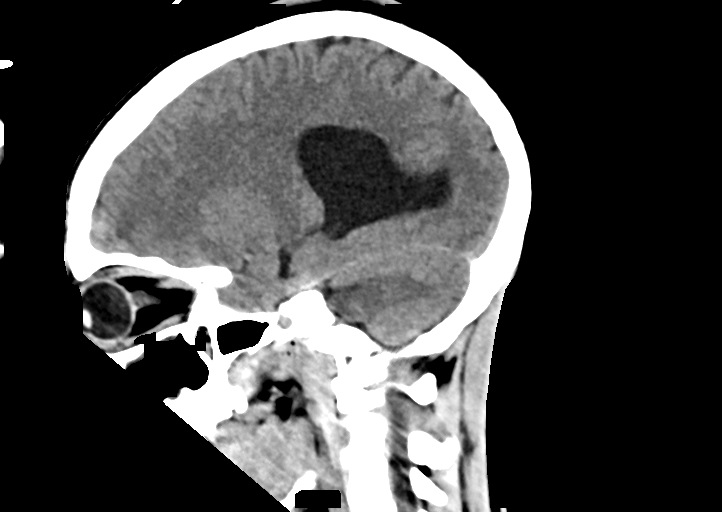
[im 33/66  brain]
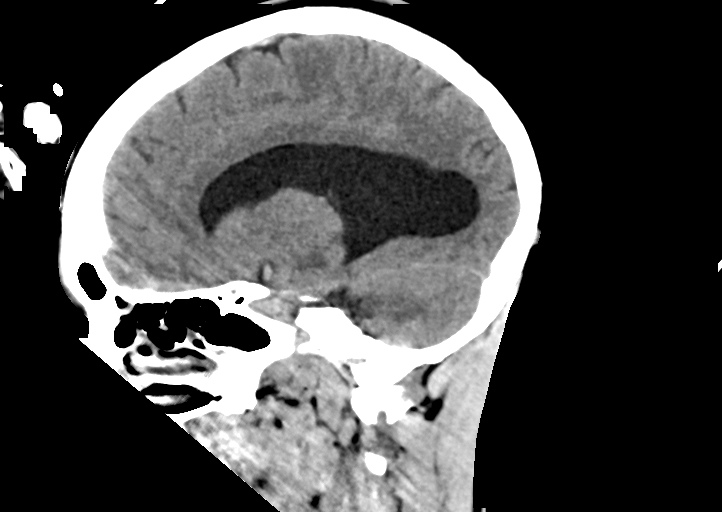
[im 44/66  brain]
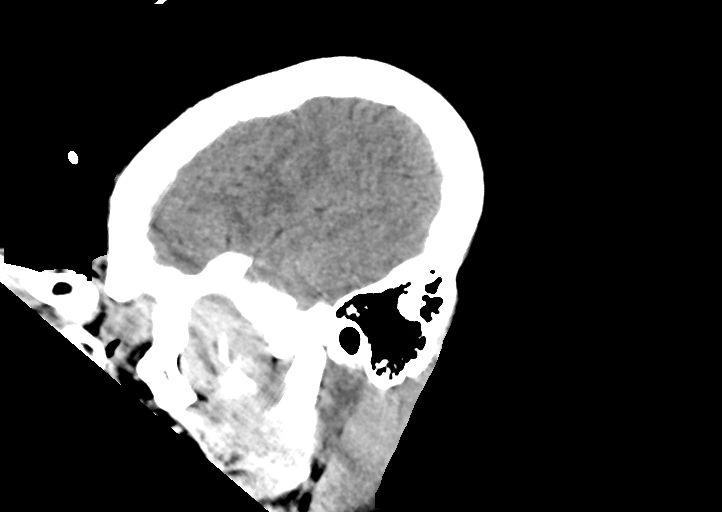

[Series 7: head wo · axial · 0.40mm/px · z∈[-226,-151]mm · 3 of 34 slices shown (2 of 3)]
[im 9/34  brain]
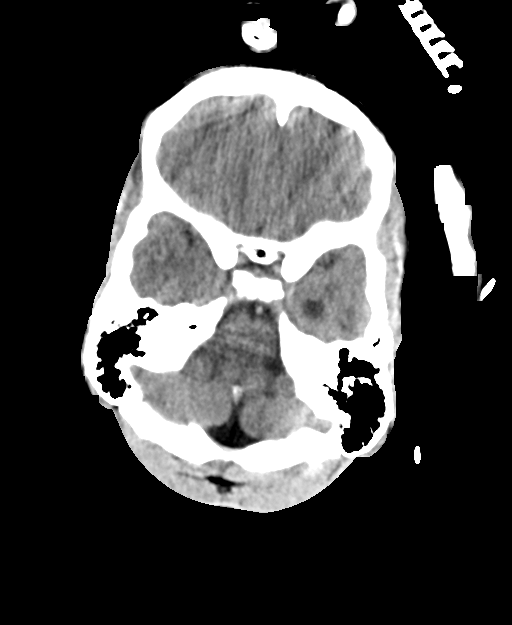
[im 17/34  brain]
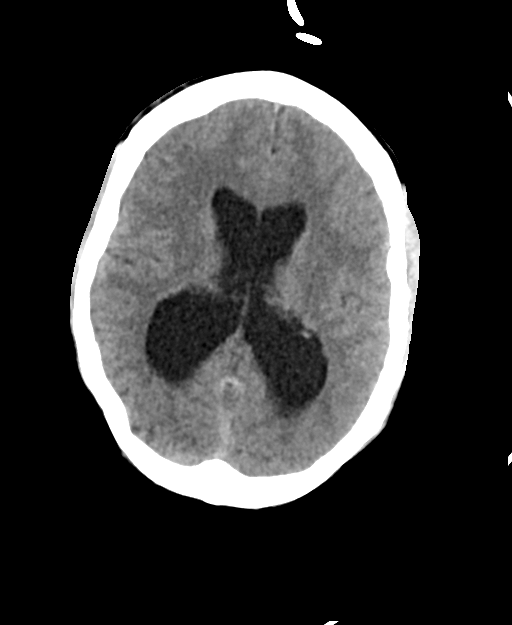
[im 25/34  brain]
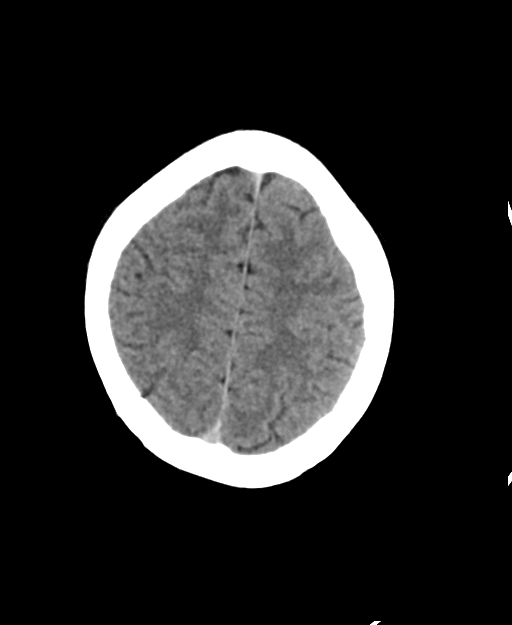

[Series 8: head wo · axial · 0.40mm/px · z∈[-226,-151]mm · 3 of 34 slices shown (3 of 3)]
[im 9/34  brain]
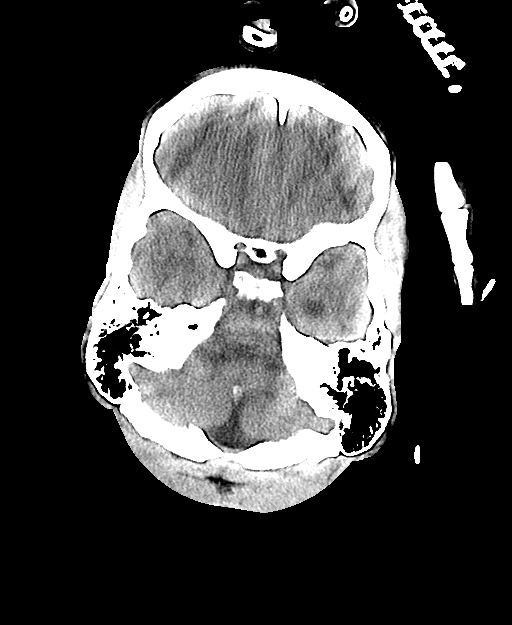
[im 17/34  brain]
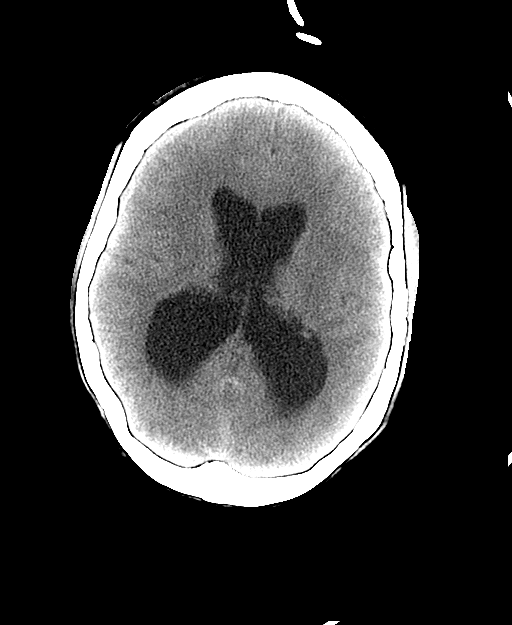
[im 25/34  brain]
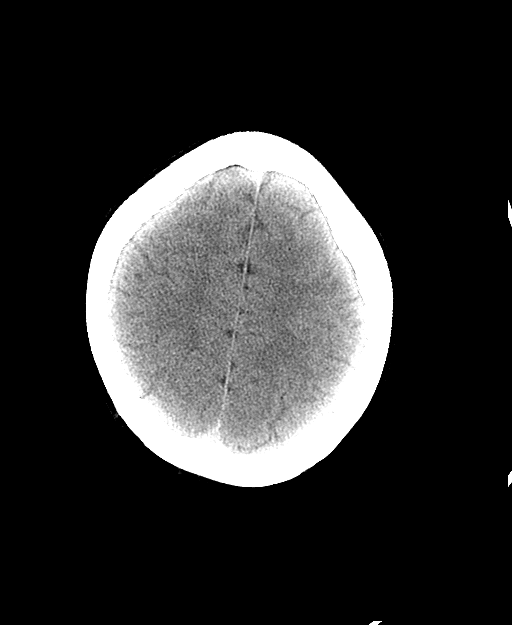

[16 of 37 positions shown; findings below may reference images not displayed]

FINDINGS: Brain: There is enlargement of the ventricles, particularly the
lateral ventricles. There is no periventricular interstitial edema.
Basal cisterns are patent. Cortical sulci at the vertex are not
effaced.

There is no acute intracranial hemorrhage or mass effect. Gray-white
differentiation is preserved.

Vascular: No hyperdense vessel or unexpected calcification.

Skull: Calvarium is unremarkable.

Sinuses/Orbits: Patchy mucosal thickening. No acute orbital
abnormality.

Other: Mastoid air cells are clear.
IMPRESSION: No acute intracranial hemorrhage or evidence of acute infarction.

Ventricular enlargement. This is likely nonacute given lack of
periventricular edema and preserved basal cisterns and cortical
sulci at the vertex.

## 2023-02-11 IMAGING — CT CT ABD-PELV W/ CM
2 of 4 series · 16 of 46 positions shown, 18 images · IV contrast (Omni 300)
Comparison: CT abdomen pelvis dated 10/10/2019.

CLINICAL DATA: 24-year-old female with acute abdominal pain.

EXAM:
CT ABDOMEN AND PELVIS WITH CONTRAST
TECHNIQUE: Multidetector CT imaging of the abdomen and pelvis was performed
using the standard protocol following bolus administration of
intravenous contrast.
CONTRAST:  100mL OMNIPAQUE IOHEXOL 300 MG/ML  SOLN

[Series 3: a/p w/ 5mm · axial · 0.77mm/px · z∈[+762,+1147]mm · 13 of 85 slices shown, 15 images]
[im 4/85  soft-tissue]
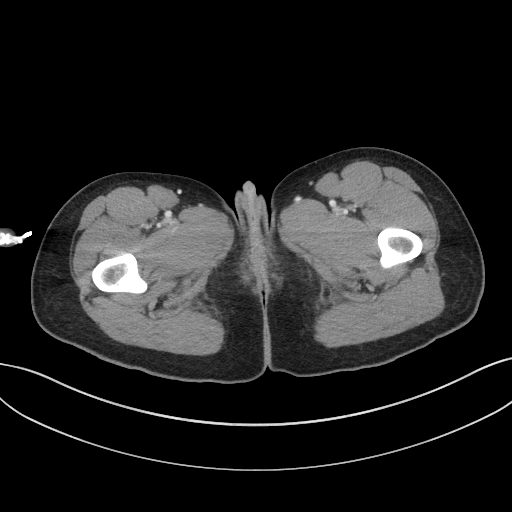
[im 4/85  bone]
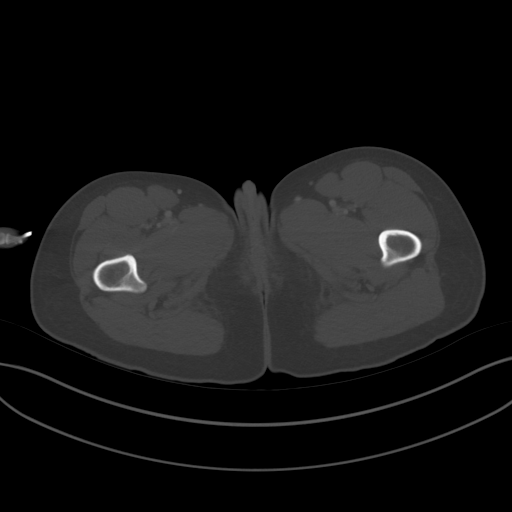
[im 11/85  soft-tissue]
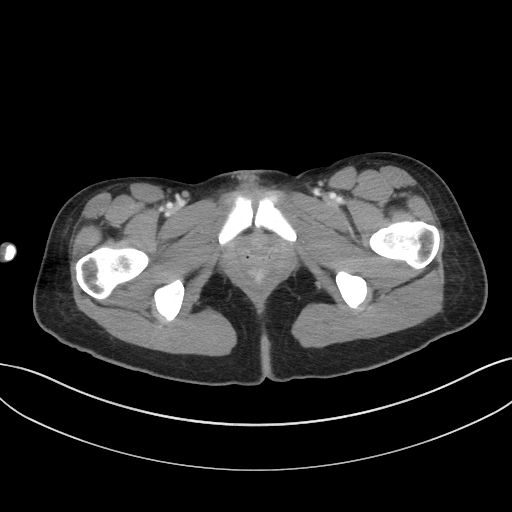
[im 17/85  soft-tissue]
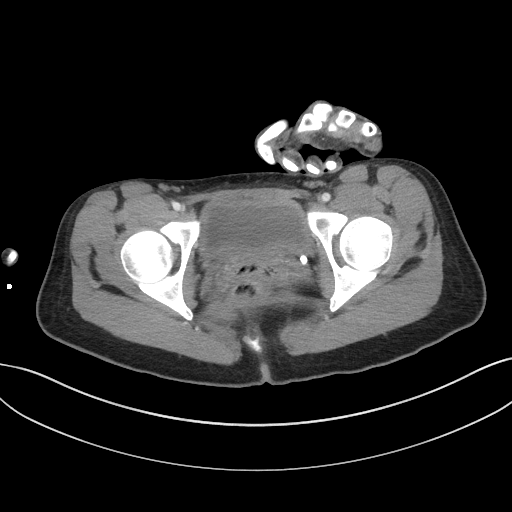
[im 24/85  soft-tissue]
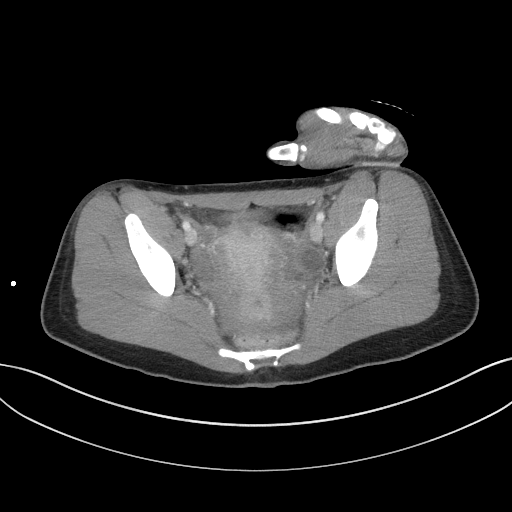
[im 31/85  soft-tissue]
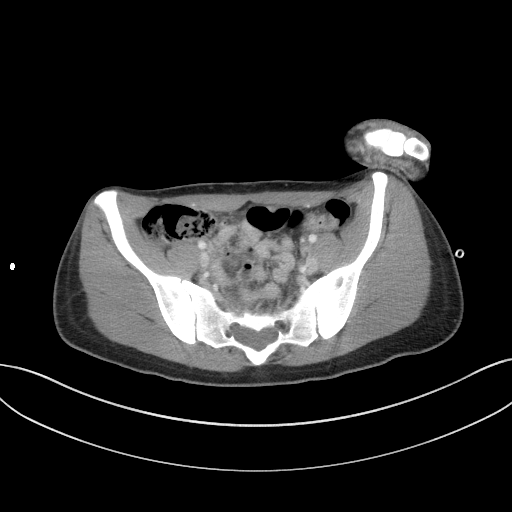
[im 37/85  soft-tissue]
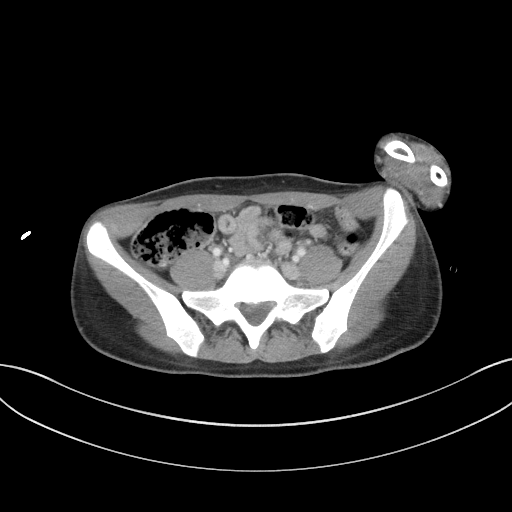
[im 44/85  soft-tissue]
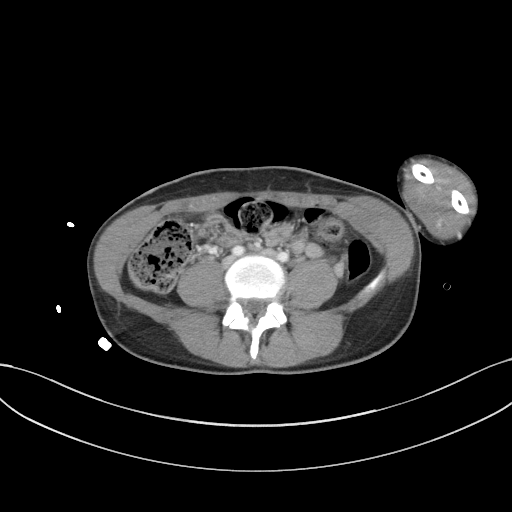
[im 48/85  soft-tissue]
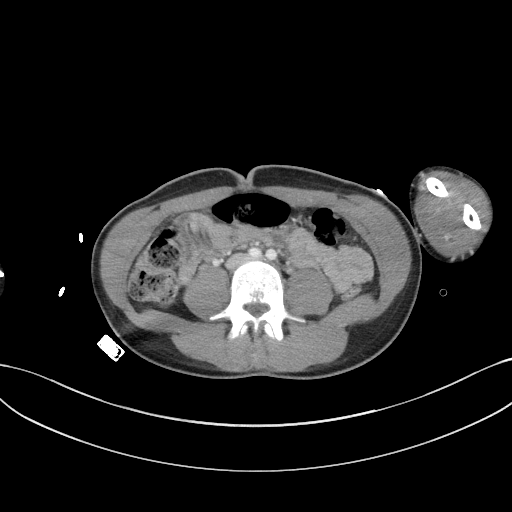
[im 54/85  soft-tissue]
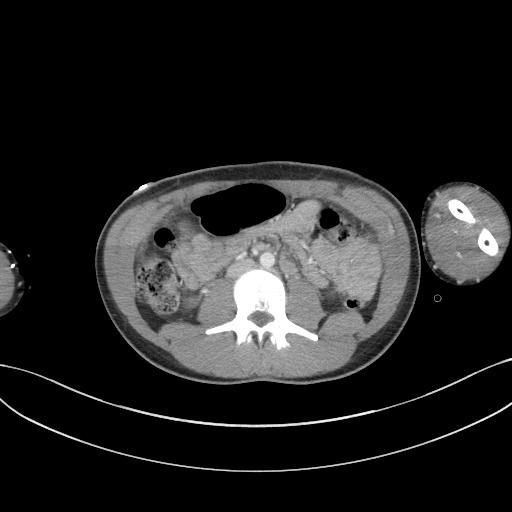
[im 54/85  bone]
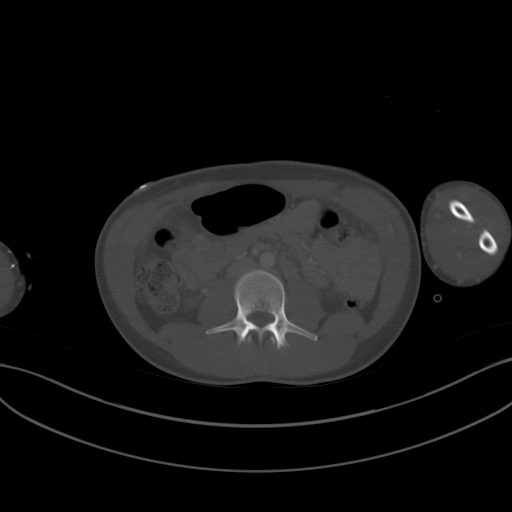
[im 61/85  soft-tissue]
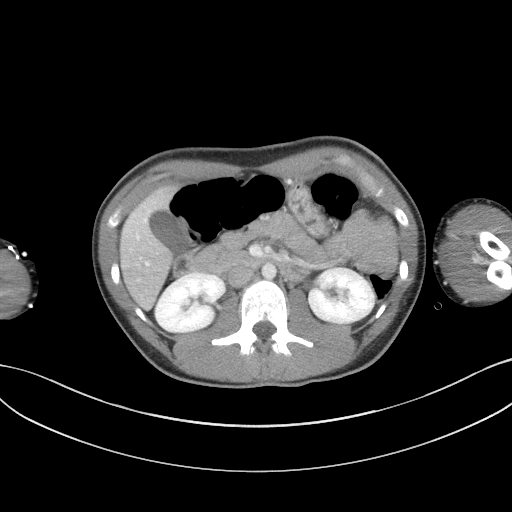
[im 68/85  soft-tissue]
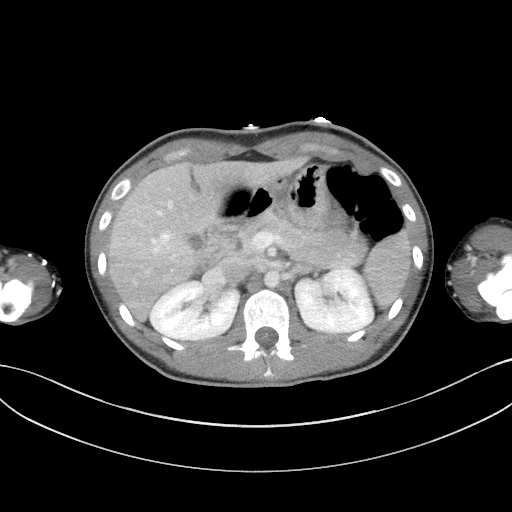
[im 74/85  soft-tissue]
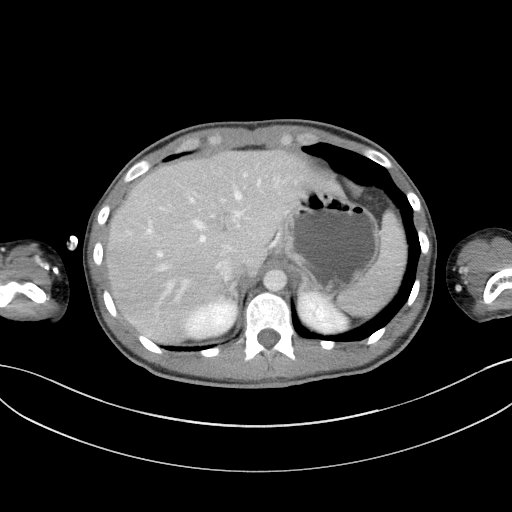
[im 81/85  soft-tissue]
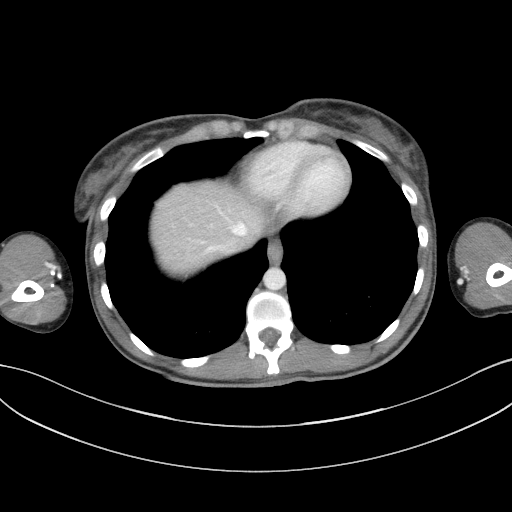

[Series 6: a/p w/ cor · coronal · 0.68mm/px · 3 of 112 slices shown]
[im 38/112  soft-tissue]
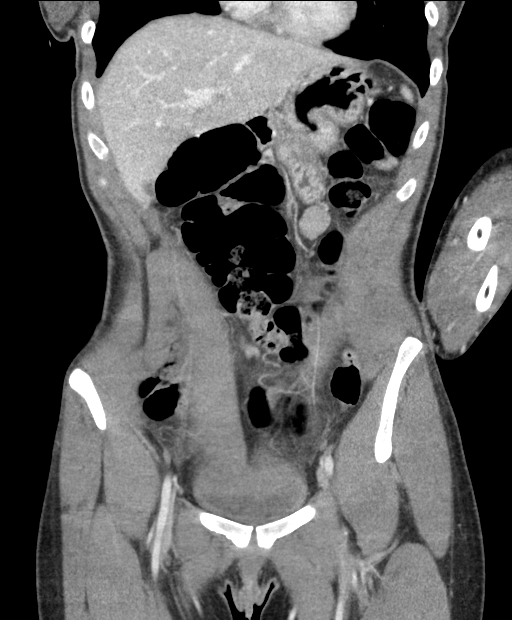
[im 50/112  soft-tissue]
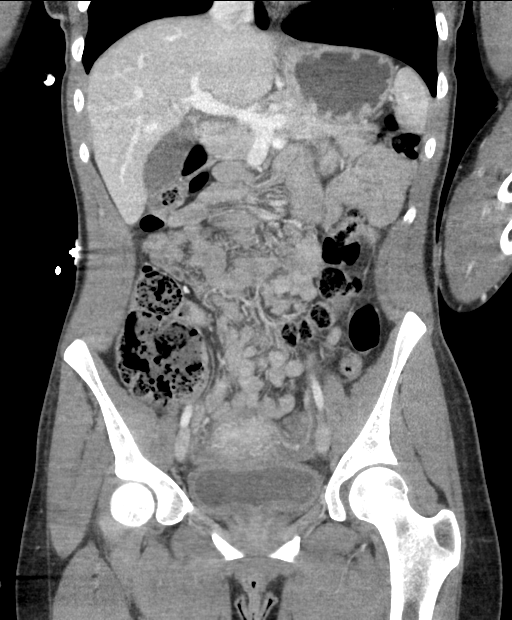
[im 62/112  soft-tissue]
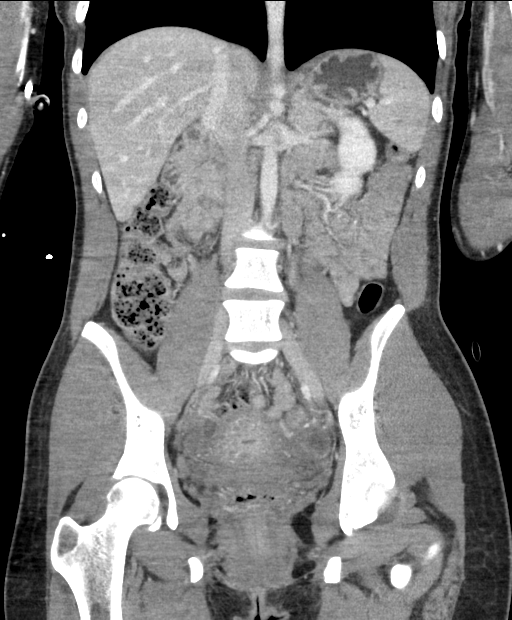

[16 of 46 positions shown; findings below may reference images not displayed]

FINDINGS: Lower chest: The visualized lung bases are clear.

No intra-abdominal free air. Probable trace free fluid in the
pelvis.

Hepatobiliary: No focal liver abnormality is seen. No gallstones,
gallbladder wall thickening, or biliary dilatation.

Pancreas: Unremarkable. No pancreatic ductal dilatation or
surrounding inflammatory changes.

Spleen: Normal in size without focal abnormality.

Adrenals/Urinary Tract: The adrenal glands are unremarkable. There
is no hydronephrosis on either side. There is symmetric enhancement
of the kidneys. Subcentimeter right renal upper pole hypodense
lesion is not characterized. The urinary bladder is partially
distended. There is apparent diffuse thickening of the bladder wall
which may be partly related to underdistention. Cystitis is not
excluded. Correlation with urinalysis recommended.

Stomach/Bowel: There is no bowel obstruction or active inflammation.
The appendix is normal.

Vascular/Lymphatic: The abdominal aorta and IVC unremarkable. No
portal venous gas. There is no adenopathy.

Reproductive: The uterus is anteverted and grossly unremarkable.
There is a 2 cm left ovarian dominant follicle or cyst. Somewhat
dilated fluid-filled tubular structures in the region of the right
adnexa and posterior pelvis ([DATE] represent dilated fallopian
tubes versus normal caliber small bowel. There is however diffuse
stranding of the pelvic floor and adnexal region concerning for
pelvic inflammatory disease. Correlation with clinical exam and
further evaluation with ultrasound recommended.

Other: None

Musculoskeletal: No acute or significant osseous findings.
IMPRESSION: 1. Findings concerning for pelvic inflammatory disease. Clinical
correlation and further evaluation with pelvic ultrasound
recommended.
2. A 2 cm left ovarian dominant follicle or cyst.
3. No bowel obstruction. Normal appendix.

## 2023-02-11 IMAGING — US US PELVIS COMPLETE
1 series · 13 of 25 positions shown · non-contrast
Comparison: None.

CLINICAL DATA: 24-year-old female with pelvic pain.

EXAM:
TRANSABDOMINAL AND TRANSVAGINAL ULTRASOUND OF PELVIS
DOPPLER ULTRASOUND OF OVARIES
TECHNIQUE: Both transabdominal and transvaginal ultrasound examinations of the
pelvis were performed. Transabdominal technique was performed for
global imaging of the pelvis including uterus, ovaries, adnexal
regions, and pelvic cul-de-sac.
It was necessary to proceed with endovaginal exam following the
transabdominal exam to visualize the endometrium and ovaries. Color
and duplex Doppler ultrasound was utilized to evaluate blood flow to
the ovaries.

[Series 1: us pelvis (transabdominal only) · 13 of 93 slices shown]
[im 1/93]
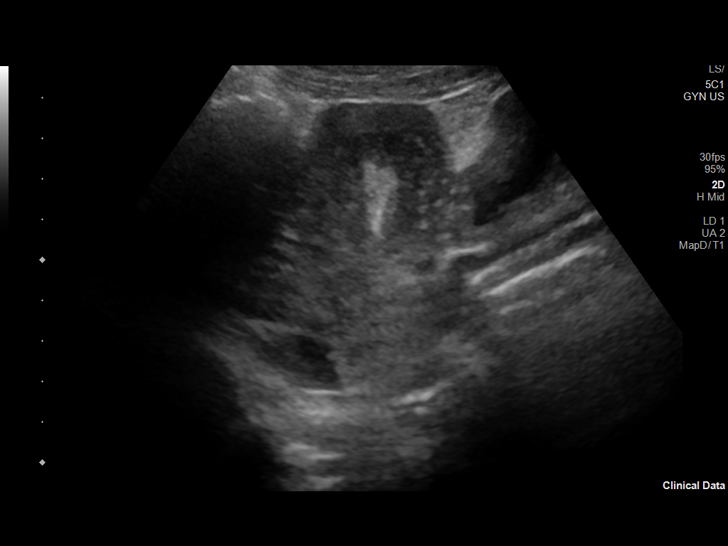
[im 8/93]
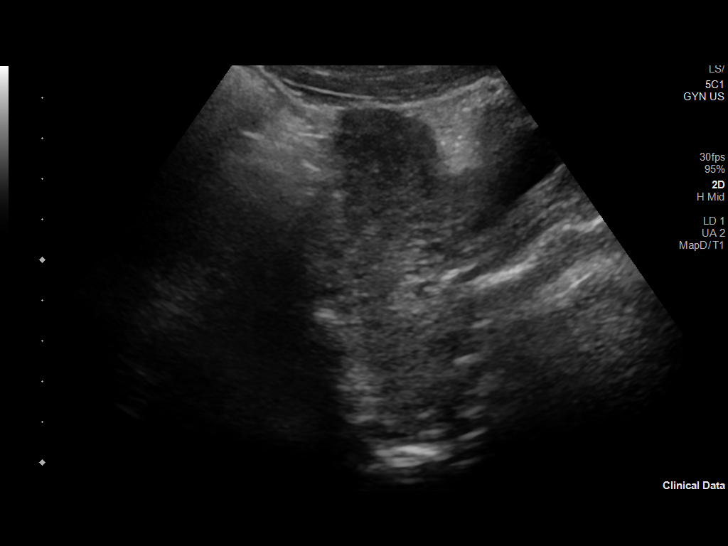
[im 16/93]
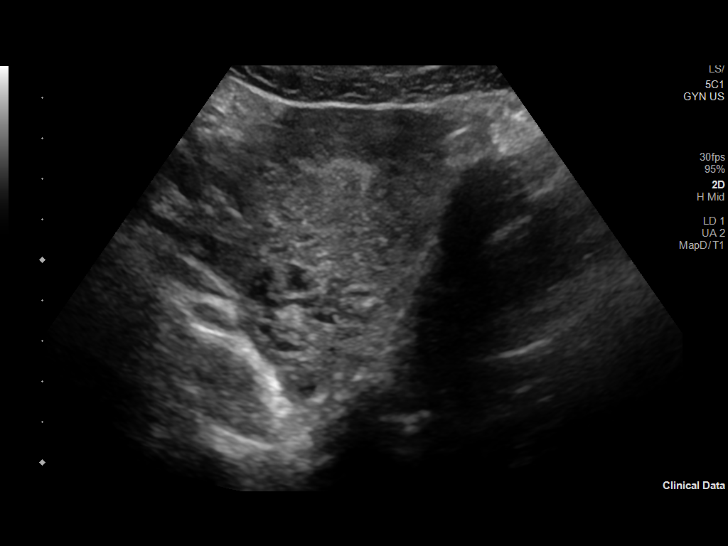
[im 24/93]
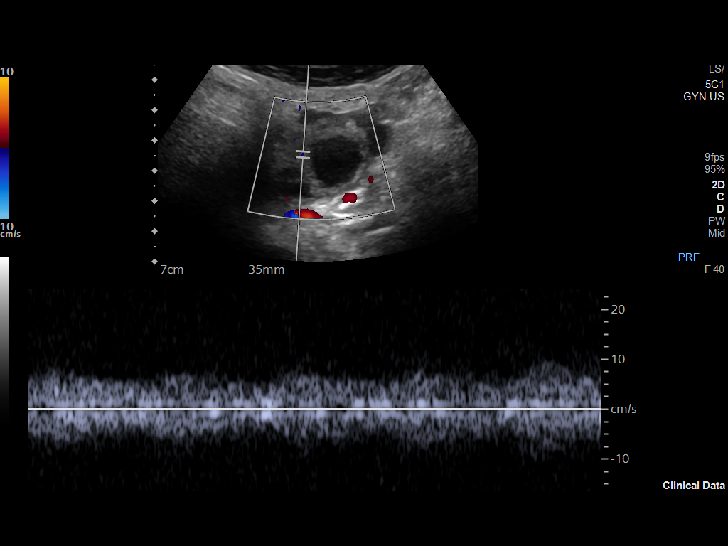
[im 31/93]
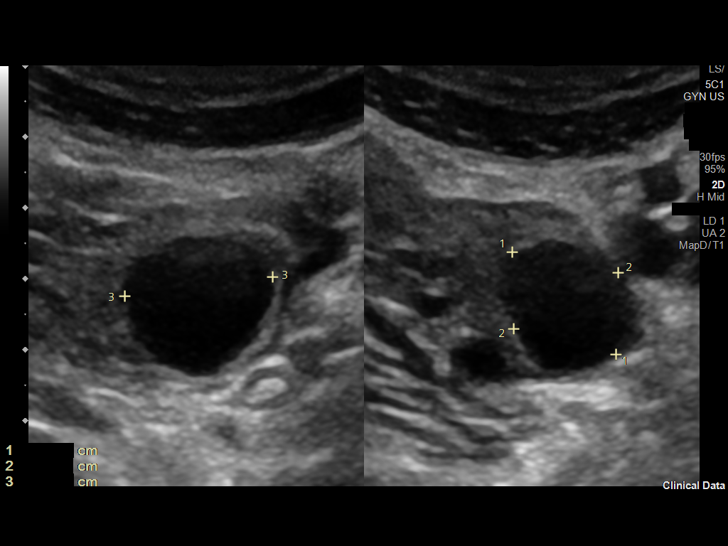
[im 39/93]
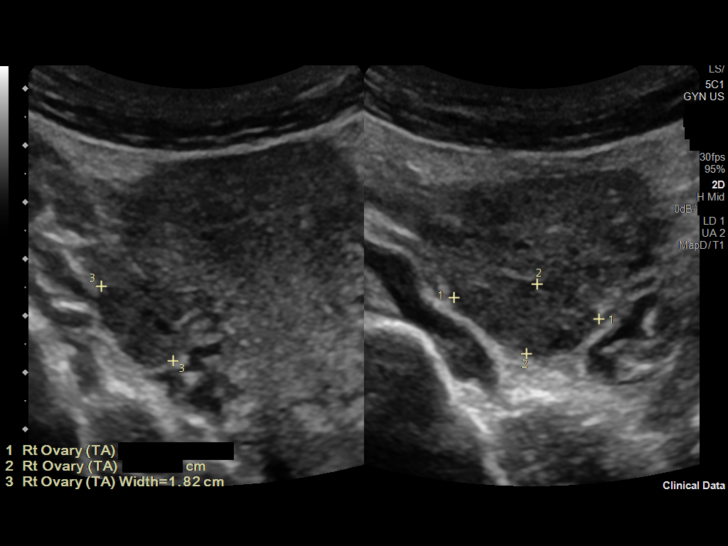
[im 47/93]
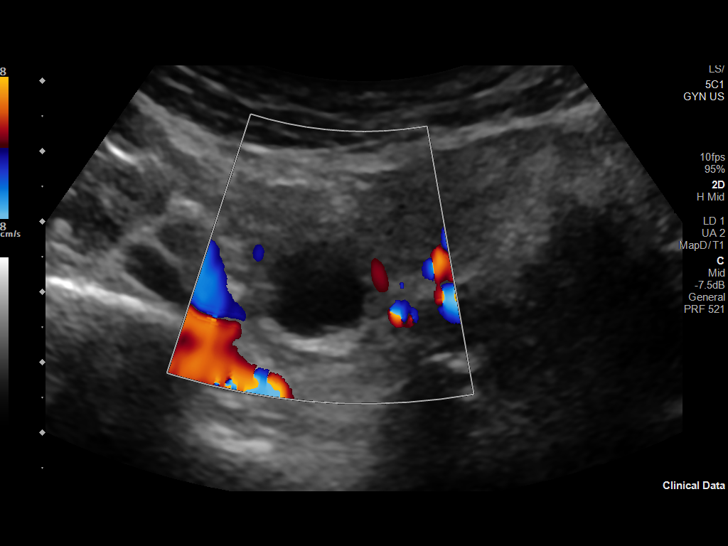
[im 54/93]
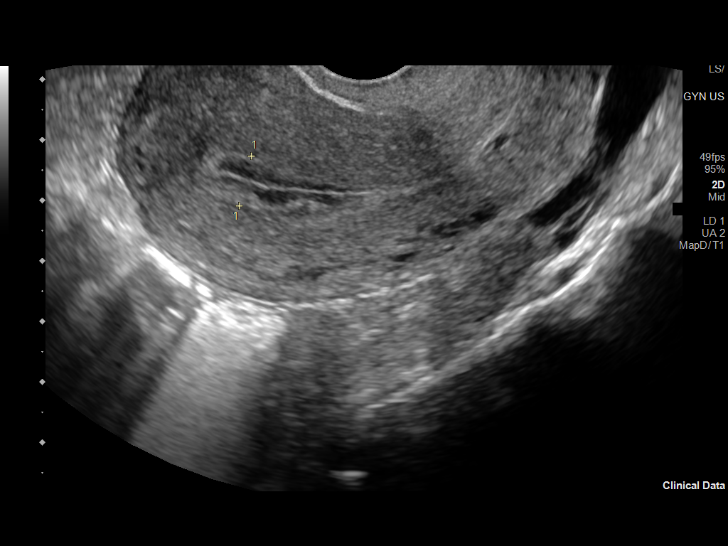
[im 62/93]
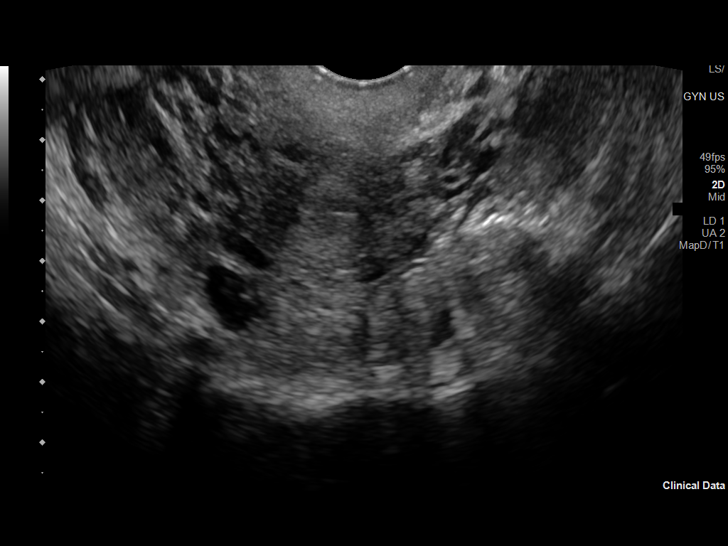
[im 70/93]
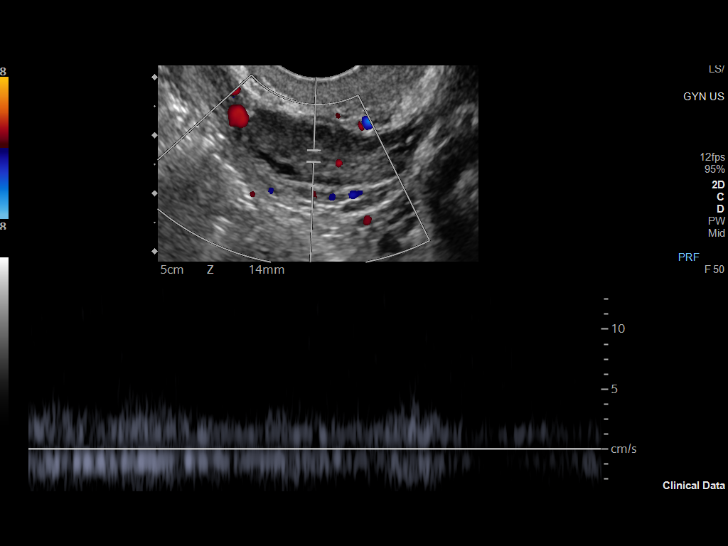
[im 77/93]
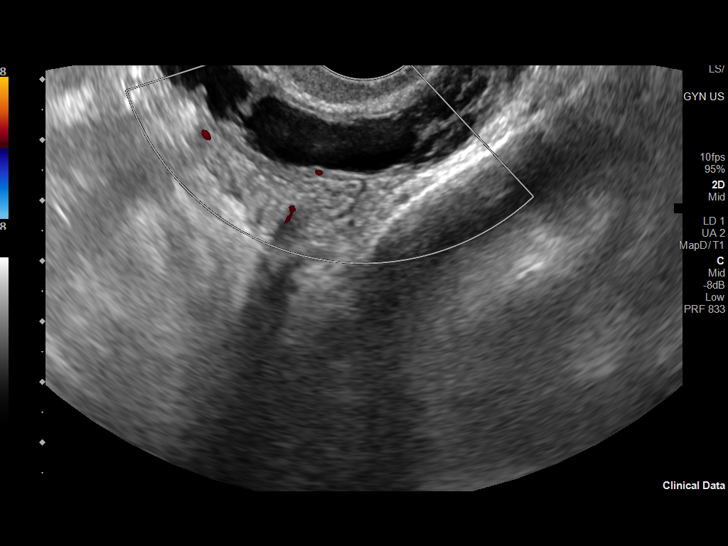
[im 85/93]
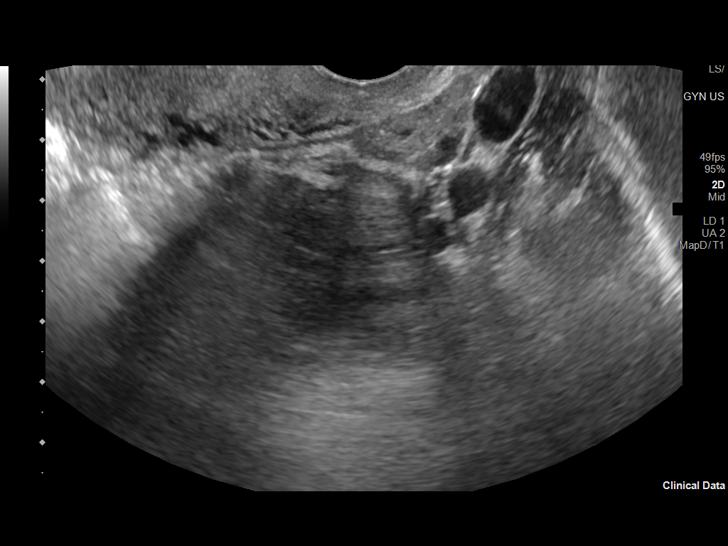
[im 93/93]
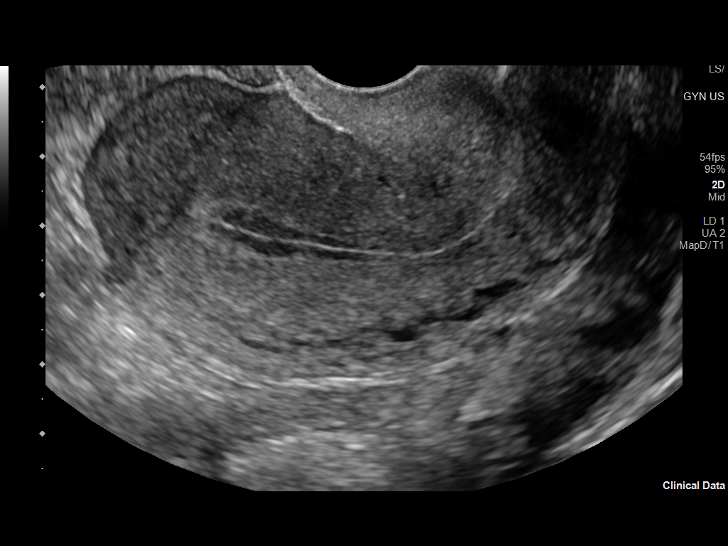

[13 of 25 positions shown; findings below may reference images not displayed]

FINDINGS: Uterus

Measurements: 7.9 x 3.8 x 5.1 cm = volume: 79 mL. The uterus is
anteverted and appears unremarkable.

Endometrium

Thickness: 9 mm.  No focal abnormality visualized.

Right ovary

Measurements: 2.6 x 1.2 x 1.8 cm = volume: 3 mL. Normal
appearance/no adnexal mass.

There is a mildly dilated tubular structure in the region of the
right adnexa measuring approximately 1 cm in diameter. There are
several internal septations within this dilated tube which may be
sequela prior inflammatory/infection and scarring or represent
proteinaceous debris.

Left ovary

Measurements: 3.6 x 2.7 x 2.9 cm = volume: 14 mL. There is a 2.1 x
1.6 x 1.7 cm cyst in the left ovary.

Pulsed Doppler evaluation of both ovaries demonstrates normal
low-resistance arterial and venous waveforms.

Other findings

No abnormal free fluid.
IMPRESSION: 1. Unremarkable uterus and endometrium.
2. A 2 cm left ovarian cyst.  Ovaries are otherwise unremarkable.
3. Mildly dilated right fallopian tube likely representing a chronic
hydrosalpinx or sequela prior inflammatory/infectious process
(pyosalpinx). Clinical correlation is recommended.

## 2023-02-27 ENCOUNTER — Encounter (HOSPITAL_COMMUNITY): Payer: Self-pay

## 2023-02-27 ENCOUNTER — Emergency Department (HOSPITAL_COMMUNITY)
Admission: EM | Admit: 2023-02-27 | Discharge: 2023-02-28 | Disposition: A | Discharge status: Home / Self Care | Payer: 59 | Attending: Emergency Medicine | Admitting: Emergency Medicine

## 2023-02-27 ENCOUNTER — Other Ambulatory Visit: Payer: Self-pay

## 2023-02-27 DIAGNOSIS — Z202 Contact with and (suspected) exposure to infections with a predominantly sexual mode of transmission: Secondary | ICD-10-CM | POA: Diagnosis not present

## 2023-02-27 DIAGNOSIS — Z0000 Encounter for general adult medical examination without abnormal findings: Secondary | ICD-10-CM | POA: Diagnosis not present

## 2023-02-27 DIAGNOSIS — Z0001 Encounter for general adult medical examination with abnormal findings: Secondary | ICD-10-CM | POA: Diagnosis not present

## 2023-02-27 NOTE — ED Triage Notes (Signed)
Pt states that her boyfriend believes that she has lice on her vagina or something else going on. Pt denies symptoms.

## 2023-02-28 NOTE — Discharge Instructions (Signed)
You have no skin changes or lice on your physical exam.

## 2023-02-28 NOTE — ED Notes (Signed)
Pt declined any further vitals.

## 2023-02-28 NOTE — ED Provider Notes (Signed)
[place] EM[facility]GENCY DEPARTMENT AT [facility] Provider Note   CSN: [id number] Arrival date & time: 02/27/23  2330     History  Chief Complaint  Patient presents with   Exposure to STD    Yvonne Waller is a 27 y.o. female who presents with request for physical exam and documentation that the does not have lice. She states her boyfriend is blaming her for itchy red eyes because "he says he got bugs in his eyes from eating my coochie". She states she is asymptomatic, without skin changes, itching, or pain. Does endorse vaginal bleeding but currently on her menstrual period.   Hx of herpes genitalis.   HPI     Home Medications Prior to Admission medications   Medication Sig Start Date End Date Taking? Authorizing Provider  pantoprazole (PROTONIX) 40 MG tablet Take 1 tablet (40 mg total) by mouth daily. 08/05/22   Dione Booze, MD      Allergies    Bee venom and Shrimp flavor    Review of Systems   Review of Systems  All other systems reviewed and are negative.   Physical Exam Updated Vital Signs BP 126/89 (BP Location: Left Arm)   Pulse (!) 110   Temp 98.7 F (37.1 C) (Oral)   Resp 18   Ht 5\' 4"  (1.626 m)   Wt 57 kg   SpO2 100%   BMI 21.57 kg/m  Physical Exam Vitals and nursing note reviewed. Exam conducted with a chaperone present (ED Tech).  Constitutional:      Appearance: She is not toxic-appearing.  HENT:     Head: Normocephalic and atraumatic.  Eyes:     General: No scleral icterus.       Right eye: No discharge.        Left eye: No discharge.     Conjunctiva/sclera: Conjunctivae normal.  Pulmonary:     Effort: Pulmonary effort is normal.  Genitourinary:    General: Normal vulva.     Pubic Area: No rash or pubic lice.   Skin:    General: Skin is warm and dry.  Neurological:     General: No focal deficit present.     Mental Status: She is alert.  Psychiatric:        Mood and Affect: Mood normal.     ED Results / Procedures /  Treatments   Labs (all labs ordered are listed, but only abnormal results are displayed) Labs Reviewed - No data to display  EKG None  Radiology No results found.  Procedures Procedures    Medications Ordered in ED Medications - No data to display  ED Course/ Medical Decision Making/ A&P                                 Medical Decision Making 27 y/o who presents with request for physical exam to rule out pubic lice.   VS reassuring on intake. No skin changes or pediculosis noted.    This was an inappropriate use of the emergency department. Patient does not have a medical concern and her cardiopulmonary and skin exam are reassuring.   Sumeya voiced understanding of her medical evaluation and treatment plan. Each of their questions answered to their expressed satisfaction.  Return precautions were given.  Patient is well-appearing, stable, and was discharged in good condition.  This chart was dictated using voice recognition software, Dragon. Despite the best efforts of this provider  to proofread and correct errors, errors may still occur which can change documentation meaning.         Final Clinical Impression(s) / ED Diagnoses Final diagnoses:  Evaluation by medical service required    Rx / DC Orders ED Discharge Orders     None         Sherrilee Gilles 02/28/23 0035    Palumbo, April, MD 02/28/23 [phone]

## 2023-03-22 DIAGNOSIS — Z760 Encounter for issue of repeat prescription: Secondary | ICD-10-CM | POA: Diagnosis not present

## 2023-03-22 DIAGNOSIS — G40909 Epilepsy, unspecified, not intractable, without status epilepticus: Secondary | ICD-10-CM | POA: Diagnosis not present

## 2023-03-22 DIAGNOSIS — F17210 Nicotine dependence, cigarettes, uncomplicated: Secondary | ICD-10-CM | POA: Diagnosis not present

## 2023-03-22 DIAGNOSIS — R29818 Other symptoms and signs involving the nervous system: Secondary | ICD-10-CM | POA: Diagnosis not present

## 2023-03-22 DIAGNOSIS — Z5900 Homelessness unspecified: Secondary | ICD-10-CM | POA: Diagnosis not present

## 2023-03-22 DIAGNOSIS — R569 Unspecified convulsions: Secondary | ICD-10-CM | POA: Diagnosis not present

## 2023-03-30 DIAGNOSIS — F17210 Nicotine dependence, cigarettes, uncomplicated: Secondary | ICD-10-CM | POA: Diagnosis not present

## 2023-03-30 DIAGNOSIS — N838 Other noninflammatory disorders of ovary, fallopian tube and broad ligament: Secondary | ICD-10-CM | POA: Diagnosis not present

## 2023-03-30 DIAGNOSIS — R1031 Right lower quadrant pain: Secondary | ICD-10-CM | POA: Diagnosis not present

## 2023-03-30 DIAGNOSIS — R1084 Generalized abdominal pain: Secondary | ICD-10-CM | POA: Diagnosis not present

## 2023-03-30 DIAGNOSIS — N920 Excessive and frequent menstruation with regular cycle: Secondary | ICD-10-CM | POA: Diagnosis not present

## 2023-03-30 DIAGNOSIS — Z8619 Personal history of other infectious and parasitic diseases: Secondary | ICD-10-CM | POA: Diagnosis not present

## 2023-03-30 DIAGNOSIS — O99891 Other specified diseases and conditions complicating pregnancy: Secondary | ICD-10-CM | POA: Diagnosis not present

## 2023-03-30 DIAGNOSIS — K5900 Constipation, unspecified: Secondary | ICD-10-CM | POA: Diagnosis not present

## 2023-03-30 DIAGNOSIS — O009 Unspecified ectopic pregnancy without intrauterine pregnancy: Secondary | ICD-10-CM | POA: Diagnosis not present

## 2023-03-30 DIAGNOSIS — Z91013 Allergy to seafood: Secondary | ICD-10-CM | POA: Diagnosis not present

## 2023-03-30 DIAGNOSIS — Z3A00 Weeks of gestation of pregnancy not specified: Secondary | ICD-10-CM | POA: Diagnosis not present

## 2023-03-30 DIAGNOSIS — Z91030 Bee allergy status: Secondary | ICD-10-CM | POA: Diagnosis not present

## 2023-03-30 DIAGNOSIS — R1903 Right lower quadrant abdominal swelling, mass and lump: Secondary | ICD-10-CM | POA: Diagnosis not present

## 2023-03-30 DIAGNOSIS — R102 Pelvic and perineal pain: Secondary | ICD-10-CM | POA: Diagnosis not present

## 2023-03-30 DIAGNOSIS — F17200 Nicotine dependence, unspecified, uncomplicated: Secondary | ICD-10-CM | POA: Diagnosis not present

## 2023-03-30 DIAGNOSIS — Z3A01 Less than 8 weeks gestation of pregnancy: Secondary | ICD-10-CM | POA: Diagnosis not present

## 2023-03-30 DIAGNOSIS — K661 Hemoperitoneum: Secondary | ICD-10-CM | POA: Diagnosis not present

## 2023-03-30 DIAGNOSIS — O00102 Left tubal pregnancy without intrauterine pregnancy: Secondary | ICD-10-CM | POA: Diagnosis not present

## 2023-03-31 DIAGNOSIS — N7093 Salpingitis and oophoritis, unspecified: Secondary | ICD-10-CM | POA: Diagnosis not present

## 2023-05-16 DIAGNOSIS — R100 Acute abdomen: Secondary | ICD-10-CM | POA: Diagnosis not present

## 2023-05-16 DIAGNOSIS — D72829 Elevated white blood cell count, unspecified: Secondary | ICD-10-CM | POA: Diagnosis not present

## 2023-05-16 DIAGNOSIS — K6289 Other specified diseases of anus and rectum: Secondary | ICD-10-CM | POA: Diagnosis not present

## 2023-05-16 DIAGNOSIS — R1084 Generalized abdominal pain: Secondary | ICD-10-CM | POA: Diagnosis not present

## 2023-05-16 DIAGNOSIS — K625 Hemorrhage of anus and rectum: Secondary | ICD-10-CM | POA: Diagnosis not present

## 2023-05-17 DIAGNOSIS — K625 Hemorrhage of anus and rectum: Secondary | ICD-10-CM | POA: Diagnosis not present

## 2023-05-18 DIAGNOSIS — K625 Hemorrhage of anus and rectum: Secondary | ICD-10-CM | POA: Diagnosis not present

## 2023-05-19 DIAGNOSIS — B9681 Helicobacter pylori [H. pylori] as the cause of diseases classified elsewhere: Secondary | ICD-10-CM | POA: Diagnosis not present

## 2023-05-19 DIAGNOSIS — K648 Other hemorrhoids: Secondary | ICD-10-CM | POA: Diagnosis not present

## 2023-05-19 DIAGNOSIS — K2950 Unspecified chronic gastritis without bleeding: Secondary | ICD-10-CM | POA: Diagnosis not present

## 2023-05-19 DIAGNOSIS — K625 Hemorrhage of anus and rectum: Secondary | ICD-10-CM | POA: Diagnosis not present

## 2023-05-29 DIAGNOSIS — Z9981 Dependence on supplemental oxygen: Secondary | ICD-10-CM | POA: Diagnosis not present

## 2023-05-29 DIAGNOSIS — F419 Anxiety disorder, unspecified: Secondary | ICD-10-CM | POA: Diagnosis not present

## 2023-05-29 DIAGNOSIS — N390 Urinary tract infection, site not specified: Secondary | ICD-10-CM | POA: Diagnosis not present

## 2023-05-29 DIAGNOSIS — F515 Nightmare disorder: Secondary | ICD-10-CM | POA: Diagnosis not present

## 2023-05-29 DIAGNOSIS — Z9152 Personal history of nonsuicidal self-harm: Secondary | ICD-10-CM | POA: Diagnosis not present

## 2023-05-29 DIAGNOSIS — R1013 Epigastric pain: Secondary | ICD-10-CM | POA: Diagnosis not present

## 2023-05-29 DIAGNOSIS — K2960 Other gastritis without bleeding: Secondary | ICD-10-CM | POA: Diagnosis not present

## 2023-05-29 DIAGNOSIS — Z5986 Financial insecurity: Secondary | ICD-10-CM | POA: Diagnosis not present

## 2023-05-29 DIAGNOSIS — R1110 Vomiting, unspecified: Secondary | ICD-10-CM | POA: Diagnosis not present

## 2023-05-29 DIAGNOSIS — B951 Streptococcus, group B, as the cause of diseases classified elsewhere: Secondary | ICD-10-CM | POA: Diagnosis not present

## 2023-05-29 DIAGNOSIS — B9681 Helicobacter pylori [H. pylori] as the cause of diseases classified elsewhere: Secondary | ICD-10-CM | POA: Diagnosis not present

## 2023-05-29 DIAGNOSIS — R000 Tachycardia, unspecified: Secondary | ICD-10-CM | POA: Diagnosis not present

## 2023-05-29 DIAGNOSIS — R569 Unspecified convulsions: Secondary | ICD-10-CM | POA: Diagnosis not present

## 2023-05-29 DIAGNOSIS — F4310 Post-traumatic stress disorder, unspecified: Secondary | ICD-10-CM | POA: Diagnosis not present

## 2023-05-29 DIAGNOSIS — F319 Bipolar disorder, unspecified: Secondary | ICD-10-CM | POA: Diagnosis not present

## 2023-05-29 DIAGNOSIS — Z91148 Patient's other noncompliance with medication regimen for other reason: Secondary | ICD-10-CM | POA: Diagnosis not present

## 2023-05-31 DIAGNOSIS — R569 Unspecified convulsions: Secondary | ICD-10-CM | POA: Diagnosis not present

## 2023-06-01 DIAGNOSIS — F4310 Post-traumatic stress disorder, unspecified: Secondary | ICD-10-CM | POA: Diagnosis not present

## 2023-06-01 DIAGNOSIS — R569 Unspecified convulsions: Secondary | ICD-10-CM | POA: Diagnosis not present

## 2023-08-31 DIAGNOSIS — Z5900 Homelessness unspecified: Secondary | ICD-10-CM | POA: Diagnosis not present

## 2023-08-31 DIAGNOSIS — Z5989 Other problems related to housing and economic circumstances: Secondary | ICD-10-CM | POA: Diagnosis not present

## 2023-08-31 DIAGNOSIS — F79 Unspecified intellectual disabilities: Secondary | ICD-10-CM | POA: Diagnosis not present

## 2023-09-09 DIAGNOSIS — Z5989 Other problems related to housing and economic circumstances: Secondary | ICD-10-CM | POA: Diagnosis not present

## 2023-09-09 DIAGNOSIS — Z5900 Homelessness unspecified: Secondary | ICD-10-CM | POA: Diagnosis not present

## 2023-09-09 DIAGNOSIS — F79 Unspecified intellectual disabilities: Secondary | ICD-10-CM | POA: Diagnosis not present

## 2023-09-12 DIAGNOSIS — F79 Unspecified intellectual disabilities: Secondary | ICD-10-CM | POA: Diagnosis not present

## 2023-09-12 DIAGNOSIS — Z5989 Other problems related to housing and economic circumstances: Secondary | ICD-10-CM | POA: Diagnosis not present

## 2023-09-12 DIAGNOSIS — Z5900 Homelessness unspecified: Secondary | ICD-10-CM | POA: Diagnosis not present

## 2023-09-17 DIAGNOSIS — Z5900 Homelessness unspecified: Secondary | ICD-10-CM | POA: Diagnosis not present

## 2023-09-17 DIAGNOSIS — Z5989 Other problems related to housing and economic circumstances: Secondary | ICD-10-CM | POA: Diagnosis not present

## 2023-09-17 DIAGNOSIS — F79 Unspecified intellectual disabilities: Secondary | ICD-10-CM | POA: Diagnosis not present

## 2023-10-18 DIAGNOSIS — N83519 Torsion of ovary and ovarian pedicle, unspecified side: Secondary | ICD-10-CM | POA: Diagnosis not present

## 2023-10-18 DIAGNOSIS — R4182 Altered mental status, unspecified: Secondary | ICD-10-CM | POA: Diagnosis not present

## 2023-10-18 DIAGNOSIS — R569 Unspecified convulsions: Secondary | ICD-10-CM | POA: Diagnosis not present

## 2023-10-18 DIAGNOSIS — N3289 Other specified disorders of bladder: Secondary | ICD-10-CM | POA: Diagnosis not present

## 2023-10-18 DIAGNOSIS — N83202 Unspecified ovarian cyst, left side: Secondary | ICD-10-CM | POA: Diagnosis not present

## 2023-10-18 DIAGNOSIS — N83201 Unspecified ovarian cyst, right side: Secondary | ICD-10-CM | POA: Diagnosis not present

## 2023-10-28 ENCOUNTER — Telehealth: Payer: Self-pay | Admitting: *Deleted

## 2023-10-28 DIAGNOSIS — R569 Unspecified convulsions: Secondary | ICD-10-CM

## 2023-10-28 NOTE — Progress Notes (Unsigned)
 Complex Care Management Note Care Guide Note  10/28/2023 Name: Yvonne Waller MRN: [id number] DOB: [birth year]   Complex Care Management Outreach Attempts: An unsuccessful telephone outreach was attempted today to offer the patient information about available complex care management services.  Follow Up Plan:  Additional outreach attempts will be made to offer the patient complex care management information and services.   Encounter Outcome:  No Answer  Harlene Satterfield  [facility]  [facility], [facility] Guide  Direct Dial: [phone]  Fax [phone]

## 2023-10-29 NOTE — Progress Notes (Unsigned)
 Complex Care Management Note Care Guide Note  10/29/2023 Name: Yvonne Waller MRN: [id number] DOB: 1995/07/09   Complex Care Management Outreach Attempts: A second unsuccessful outreach was attempted today to offer the patient with information about available complex care management services.  Follow Up Plan:  Additional outreach attempts will be made to offer the patient complex care management information and services.   Encounter Outcome:  No Answer  Harlene Satterfield  [facility]  [facility], [facility] Guide  Direct Dial: [phone]  Fax [phone]

## 2023-10-30 NOTE — Progress Notes (Signed)
 Complex Care Management Note Care Guide Note  10/30/2023 Name: Yvonne Waller MRN: [id number] DOB: [birth year]   Complex Care Management Outreach Attempts: A third unsuccessful outreach was attempted today to offer the patient with information about available complex care management services.  Follow Up Plan:  No further outreach attempts will be made at this time. We have been unable to contact the patient to offer or enroll patient in complex care management services.  Encounter Outcome:  No Answer  Harlene Satterfield  [facility]  [facility], [facility] Guide  Direct Dial: [phone]  Fax [phone]
